# Patient Record
Sex: Female | Born: 1980 | Race: White | Hispanic: No | Marital: Single | Smoking: Current every day smoker
Health system: Southern US, Community
[De-identification: ages and names within clinical notes are randomized; demographics above are authoritative.]

## PROBLEM LIST (undated history)

## (undated) DIAGNOSIS — F32A Depression, unspecified: Secondary | ICD-10-CM

## (undated) DIAGNOSIS — G40909 Epilepsy, unspecified, not intractable, without status epilepticus: Secondary | ICD-10-CM

## (undated) DIAGNOSIS — F419 Anxiety disorder, unspecified: Secondary | ICD-10-CM

## (undated) DIAGNOSIS — F1011 Alcohol abuse, in remission: Secondary | ICD-10-CM

## (undated) DIAGNOSIS — F1910 Other psychoactive substance abuse, uncomplicated: Secondary | ICD-10-CM

## (undated) DIAGNOSIS — F329 Major depressive disorder, single episode, unspecified: Secondary | ICD-10-CM

## (undated) DIAGNOSIS — D649 Anemia, unspecified: Secondary | ICD-10-CM

## (undated) DIAGNOSIS — Z87442 Personal history of urinary calculi: Secondary | ICD-10-CM

## (undated) HISTORY — DX: Major depressive disorder, single episode, unspecified: F32.9

## (undated) HISTORY — PX: COLPOSCOPY: SHX161

## (undated) HISTORY — DX: Personal history of urinary calculi: Z87.442

## (undated) HISTORY — DX: Other psychoactive substance abuse, uncomplicated: F19.10

## (undated) HISTORY — PX: URETERAL STENT PLACEMENT: SHX822

## (undated) HISTORY — DX: Alcohol abuse, in remission: F10.11

## (undated) HISTORY — DX: Anxiety disorder, unspecified: F41.9

## (undated) HISTORY — DX: Depression, unspecified: F32.A

## (undated) HISTORY — PX: URETEROLITHOTOMY: SHX71

## (undated) HISTORY — PX: APPENDECTOMY: SHX54

## (undated) HISTORY — DX: Anemia, unspecified: D64.9

## (undated) HISTORY — DX: Epilepsy, unspecified, not intractable, without status epilepticus: G40.909

---

## 2006-01-20 HISTORY — PX: BREAST BIOPSY: SHX20

## 2009-10-18 DIAGNOSIS — R000 Tachycardia, unspecified: Secondary | ICD-10-CM | POA: Insufficient documentation

## 2010-01-09 DIAGNOSIS — G479 Sleep disorder, unspecified: Secondary | ICD-10-CM | POA: Insufficient documentation

## 2010-08-01 DIAGNOSIS — G40209 Localization-related (focal) (partial) symptomatic epilepsy and epileptic syndromes with complex partial seizures, not intractable, without status epilepticus: Secondary | ICD-10-CM | POA: Insufficient documentation

## 2012-06-30 DIAGNOSIS — G40909 Epilepsy, unspecified, not intractable, without status epilepticus: Secondary | ICD-10-CM | POA: Insufficient documentation

## 2012-07-14 DIAGNOSIS — N200 Calculus of kidney: Secondary | ICD-10-CM | POA: Insufficient documentation

## 2013-07-20 HISTORY — PX: LEEP: SHX91

## 2014-01-20 HISTORY — PX: OVARIAN CYST REMOVAL: SHX89

## 2016-03-31 ENCOUNTER — Ambulatory Visit (INDEPENDENT_AMBULATORY_CARE_PROVIDER_SITE_OTHER): Payer: Managed Care, Other (non HMO) | Admitting: Obstetrics and Gynecology

## 2016-03-31 ENCOUNTER — Encounter: Payer: Self-pay | Admitting: Obstetrics and Gynecology

## 2016-03-31 VITALS — BP 98/64 | HR 72 | Resp 14 | Ht 63.5 in | Wt 113.0 lb

## 2016-03-31 DIAGNOSIS — N6320 Unspecified lump in the left breast, unspecified quadrant: Secondary | ICD-10-CM

## 2016-03-31 DIAGNOSIS — Z23 Encounter for immunization: Secondary | ICD-10-CM | POA: Diagnosis not present

## 2016-03-31 DIAGNOSIS — Z01411 Encounter for gynecological examination (general) (routine) with abnormal findings: Secondary | ICD-10-CM | POA: Diagnosis not present

## 2016-03-31 DIAGNOSIS — Z9189 Other specified personal risk factors, not elsewhere classified: Secondary | ICD-10-CM

## 2016-03-31 DIAGNOSIS — Z113 Encounter for screening for infections with a predominantly sexual mode of transmission: Secondary | ICD-10-CM

## 2016-03-31 DIAGNOSIS — Z124 Encounter for screening for malignant neoplasm of cervix: Secondary | ICD-10-CM

## 2016-03-31 DIAGNOSIS — N6325 Unspecified lump in the left breast, overlapping quadrants: Secondary | ICD-10-CM

## 2016-03-31 DIAGNOSIS — Z0000 Encounter for general adult medical examination without abnormal findings: Secondary | ICD-10-CM | POA: Diagnosis not present

## 2016-03-31 DIAGNOSIS — Z2839 Other underimmunization status: Secondary | ICD-10-CM

## 2016-03-31 LAB — CBC
HCT: 38.8 % (ref 35.0–45.0)
Hemoglobin: 12.7 g/dL (ref 11.7–15.5)
MCH: 30.5 pg (ref 27.0–33.0)
MCHC: 32.7 g/dL (ref 32.0–36.0)
MCV: 93 fL (ref 80.0–100.0)
MPV: 9.9 fL (ref 7.5–12.5)
Platelets: 344 10*3/uL (ref 140–400)
RBC: 4.17 MIL/uL (ref 3.80–5.10)
RDW: 13.1 % (ref 11.0–15.0)
WBC: 4.7 10*3/uL (ref 3.8–10.8)

## 2016-03-31 LAB — COMPREHENSIVE METABOLIC PANEL
ALT: 11 U/L (ref 6–29)
AST: 11 U/L (ref 10–30)
Albumin: 4.5 g/dL (ref 3.6–5.1)
Alkaline Phosphatase: 44 U/L (ref 33–115)
BUN: 9 mg/dL (ref 7–25)
CO2: 27 mmol/L (ref 20–31)
Calcium: 9.6 mg/dL (ref 8.6–10.2)
Chloride: 103 mmol/L (ref 98–110)
Creat: 0.73 mg/dL (ref 0.50–1.10)
Glucose, Bld: 82 mg/dL (ref 65–99)
Potassium: 4.6 mmol/L (ref 3.5–5.3)
Sodium: 138 mmol/L (ref 135–146)
Total Bilirubin: 0.5 mg/dL (ref 0.2–1.2)
Total Protein: 7 g/dL (ref 6.1–8.1)

## 2016-03-31 LAB — LIPID PANEL
Cholesterol: 235 mg/dL — ABNORMAL HIGH (ref <200)
HDL: 69 mg/dL (ref >50)
LDL Cholesterol: 158 mg/dL — ABNORMAL HIGH (ref <100)
Total CHOL/HDL Ratio: 3.4 Ratio (ref <5.0)
Triglycerides: 41 mg/dL (ref <150)
VLDL: 8 mg/dL (ref <30)

## 2016-03-31 LAB — HEPATITIS C ANTIBODY: HCV Ab: NEGATIVE

## 2016-03-31 NOTE — Progress Notes (Signed)
36 y.o. G0P0000 SingleCaucasianF here for annual exam.  The patient has a mirena IUD, placed in 9/15. No menses, no pain. Not sexually active.   She would like STD testing, ex-boyfriend cheated.     No LMP recorded. Patient is not currently having periods (Reason: IUD).          Sexually active: No.  The current method of family planning is IUD.   Mirena placed 09/30/13.  Exercising: No.  The patient does not participate in regular exercise at present. Smoker:  Yes- e-cigs (former smoker of cigarettes)   Health Maintenance: Pap: 02/13/14 negative with negative HPV History of abnormal Pap:  Yes- + HR HPV. H/O HSIL, s/p leep in 7/15 with +margins.  MMG:  2008 -BX of breast- cyst Colonoscopy:  2013 WNL per patient  BMD:   Never TDaP:  unsure Gardasil: no    reports that she has been smoking E-cigarettes.  She has never used smokeless tobacco. She reports that she does not drink alcohol or use drugs. Works in Engineering ge[employer]. Plans to quit e cigarettes.   Past Medical History:  Diagnosis Date  . Anemia   . Anxiety   . Depression   . History of alcohol abuse   . History of kidney stones   . Seizure disorder (HCC)   . Substance abuse   Sober x 3.5 years  Past Surgical History:  Procedure Laterality Date  . APPENDECTOMY    . BREAST BIOPSY    . COLPOSCOPY    . LEEP  07/2013   HSIL  . OVARIAN CYST REMOVAL  2016  . URETERAL STENT PLACEMENT    . URETEROLITHOTOMY      Current Outpatient Prescriptions  Medication Sig Dispense Refill  . Cyanocobalamin (VITAMIN B 12 PO) Take 3,000 mcg by mouth.    . escitalopram (LEXAPRO) 10 MG tablet Take 10 mg by mouth.    . lamoTRIgine (LAMICTAL) 100 MG tablet take 2 tablets by mouth twice a day    . levonorgestrel (MIRENA) 20 MCG/24HR IUD 1 each by Intrauterine route once.    . potassium citrate (UROCIT-K) 10 MEQ (1080 MG) SR tablet Take by mouth.    . Prenatal Vit-Fe Fumarate-FA (PRENATAL MULTIVITAMIN) TABS tablet Take 1 tablet by mouth daily at 12  noon.     No current facility-administered medications for this visit.     Family History  Problem Relation Age of Onset  . Heart disease Father   . Diabetes Maternal Grandmother     Review of Systems  Constitutional: Negative.   HENT: Negative.   Eyes: Negative.   Respiratory: Negative.   Cardiovascular: Negative.   Gastrointestinal: Negative.   Endocrine: Negative.   Genitourinary: Negative.   Musculoskeletal: Negative.   Skin: Negative.   Allergic/Immunologic: Negative.   Neurological: Negative.   Psychiatric/Behavioral: Negative.     Exam:   BP 98/64 (BP Location: Right Arm, Patient Position: Sitting, Cuff Size: Normal)   Pulse 72   Resp 14   Wt 113 lb (51.3 kg)   Weight change: @WEIGHTCHANGE @ Height:      Ht Readings from Last 3 Encounters:  No data found for Ht    General appearance: alert, cooperative and appears stated age Head: Normocephalic, without obvious abnormality, atraumatic Neck: no adenopathy, supple, symmetrical, trachea midline and thyroid normal to inspection and palpation Lungs: clear to auscultation bilaterally Cardiovascular: regular rate and rhythm Breasts: In the left breast a few cm from the areolar region at 12-1 o'clock is a 1.5 cm  smooth, mobile, lima bean shaped, non tender lump Heart: regular rate and rhythm Abdomen: soft, non-tender; bowel sounds normal; no masses,  no organomegaly Extremities: extremities normal, atraumatic, no cyanosis or edema Skin: Skin color, texture, turgor normal. No rashes or lesions Lymph nodes: Cervical, supraclavicular, and axillary nodes normal. No abnormal inguinal nodes palpated Neurologic: Grossly normal   Pelvic: External genitalia:  no lesions              Urethra:  normal appearing urethra with no masses, tenderness or lesions              Bartholins and Skenes: normal                 Vagina: normal appearing vagina with normal color and discharge, no lesions              Cervix: no lesions and  IUD string 1 cm               Bimanual Exam:  Uterus:  normal size, contour, position, consistency, mobility, non-tender and anteverted              Adnexa: no mass, fullness, tenderness               Rectovaginal: Confirms               Anus:  normal sphincter tone, no lesions  Chaperone was present for exam.  A:  Well Woman exam  Breast lump on left  H/O HSIL, S/P LEEP in 2015 with + margins, negative pap and negative hpv in 1/16  P:   Pap with hpv  TDAP today  Screening labs  STD testing  Discussed breast self exam  Discussed calcium and vit D intake  Diagnostic breast imaging, attention left breast at 12 o'clock

## 2016-03-31 NOTE — Patient Instructions (Addendum)
EXERCISE AND DIET:  We recommended that you start or continue a regular exercise program for good health. Regular exercise means any activity that makes your heart beat faster and makes you sweat.  We recommend exercising at least 30 minutes per day at least 3 days a week, preferably 4 or 5.  We also recommend a diet low in fat and sugar.  Inactivity, poor dietary choices and obesity can cause diabetes, heart attack, stroke, and kidney damage, among others.    ALCOHOL AND SMOKING:  Women should limit their alcohol intake to no more than 7 drinks/beers/glasses of wine (combined, not each!) per week. Moderation of alcohol intake to this level decreases your risk of breast cancer and liver damage. And of course, no recreational drugs are part of a healthy lifestyle.  And absolutely no smoking or even second hand smoke. Most people know smoking can cause heart and lung diseases, but did you know it also contributes to weakening of your bones? Aging of your skin?  Yellowing of your teeth and nails?  CALCIUM AND VITAMIN D:  Adequate intake of calcium and Vitamin D are recommended.  The recommendations for exact amounts of these supplements seem to change often, but generally speaking 600 mg of calcium (either carbonate or citrate) and 800 units of Vitamin D per day seems prudent. Certain women may benefit from higher intake of Vitamin D.  If you are among these women, your doctor will have told you during your visit.    PAP SMEARS:  Pap smears, to check for cervical cancer or precancers,  have traditionally been done yearly, although recent scientific advances have shown that most women can have pap smears less often.  However, every woman still should have a physical exam from her gynecologist every year. It will include a breast check, inspection of the vulva and vagina to check for abnormal growths or skin changes, a visual exam of the cervix, and then an exam to evaluate the size and shape of the uterus and  ovaries.  And after 35 years of age, a rectal exam is indicated to check for rectal cancers. We will also provide age appropriate advice regarding health maintenance, like when you should have certain vaccines, screening for sexually transmitted diseases, bone density testing, colonoscopy, mammograms, etc.   MAMMOGRAMS:  All women over 40 years old should have a yearly mammogram. Many facilities now offer a "3D" mammogram, which may cost around $50 extra out of pocket. If possible,  we recommend you accept the option to have the 3D mammogram performed.  It both reduces the number of women who will be called back for extra views which then turn out to be normal, and it is better than the routine mammogram at detecting truly abnormal areas.    COLONOSCOPY:  Colonoscopy to screen for colon cancer is recommended for all women at age 50.  We know, you hate the idea of the prep.  We agree, BUT, having colon cancer and not knowing it is worse!!  Colon cancer so often starts as a polyp that can be seen and removed at colonscopy, which can quite literally save your life!  And if your first colonoscopy is normal and you have no family history of colon cancer, most women don't have to have it again for 10 years.  Once every ten years, you can do something that may end up saving your life, right?  We will be happy to help you get it scheduled when you are ready.    Be sure to check your insurance coverage so you understand how much it will cost.  It may be covered as a preventative service at no cost, but you should check your particular policy.      Breast Self-Awareness Breast self-awareness means being familiar with how your breasts look and feel. It involves checking your breasts regularly and reporting any changes to your health care provider. Practicing breast self-awareness is important. A change in your breasts can be a sign of a serious medical problem. Being familiar with how your breasts look and feel allows  you to find any problems early, when treatment is more likely to be successful. All women should practice breast self-awareness, including women who have had breast implants. How to do a breast self-exam One way to learn what is normal for your breasts and whether your breasts are changing is to do a breast self-exam. To do a breast self-exam: Look for Changes   1. Remove all the clothing above your waist. 2. Stand in front of a mirror in a room with good lighting. 3. Put your hands on your hips. 4. Push your hands firmly downward. 5. Compare your breasts in the mirror. Look for differences between them (asymmetry), such as:  Differences in shape.  Differences in size.  Puckers, dips, and bumps in one breast and not the other. 6. Look at each breast for changes in your skin, such as:  Redness.  Scaly areas. 7. Look for changes in your nipples, such as:  Discharge.  Bleeding.  Dimpling.  Redness.  A change in position. Feel for Changes   Carefully feel your breasts for lumps and changes. It is best to do this while lying on your back on the floor and again while sitting or standing in the shower or tub with soapy water on your skin. Feel each breast in the following way:  Place the arm on the side of the breast you are examining above your head.  Feel your breast with the other hand.  Start in the nipple area and make  inch (2 cm) overlapping circles to feel your breast. Use the pads of your three middle fingers to do this. Apply light pressure, then medium pressure, then firm pressure. The light pressure will allow you to feel the tissue closest to the skin. The medium pressure will allow you to feel the tissue that is a little deeper. The firm pressure will allow you to feel the tissue close to the ribs.  Continue the overlapping circles, moving downward over the breast until you feel your ribs below your breast.  Move one finger-width toward the center of the body.  Continue to use the  inch (2 cm) overlapping circles to feel your breast as you move slowly up toward your collarbone.  Continue the up and down exam using all three pressures until you reach your armpit. Write Down What You Find   Write down what is normal for each breast and any changes that you find. Keep a written record with breast changes or normal findings for each breast. By writing this information down, you do not need to depend only on memory for size, tenderness, or location. Write down where you are in your menstrual cycle, if you are still menstruating. If you are having trouble noticing differences in your breasts, do not get discouraged. With time you will become more familiar with the variations in your breasts and more comfortable with the exam. How often should I examine my   breasts? Examine your breasts every month. If you are breastfeeding, the best time to examine your breasts is after a feeding or after using a breast pump. If you menstruate, the best time to examine your breasts is 5-7 days after your period is over. During your period, your breasts are lumpier, and it may be more difficult to notice changes. When should I see my health care provider? See your health care provider if you notice:  A change in shape or size of your breasts or nipples.  A change in the skin of your breast or nipples, such as a reddened or scaly area.  Unusual discharge from your nipples.  A lump or thick area that was not there before.  Pain in your breasts.  Anything that concerns you. This information is not intended to replace advice given to you by your health care provider. Make sure you discuss any questions you have with your health care provider. Document Released: 01/06/2005 Document Revised: 06/14/2015 Document Reviewed: 11/26/2014 Elsevier Interactive Patient Education  2017 [employer]  

## 2016-04-01 ENCOUNTER — Telehealth: Payer: Self-pay

## 2016-04-01 DIAGNOSIS — N6320 Unspecified lump in the left breast, unspecified quadrant: Secondary | ICD-10-CM

## 2016-04-01 LAB — STD PANEL
HIV 1&2 Ab, 4th Generation: NONREACTIVE
Hepatitis B Surface Ag: NEGATIVE

## 2016-04-01 LAB — VITAMIN D 25 HYDROXY (VIT D DEFICIENCY, FRACTURES): Vit D, 25-Hydroxy: 30 ng/mL (ref 30–100)

## 2016-04-01 NOTE — Telephone Encounter (Signed)
Call to the [facility] appointment for bilateral diagnostic imaging with left breast ultrasound scheduled for 04/07/2016 at 9 am with 8:45 am arrival.  Left message for patient to call Kaitlyn at [phone].

## 2016-04-01 NOTE — Telephone Encounter (Signed)
-----   Message from Romualdo BolkJill Evelyn Jertson, MD sent at 03/31/2016 11:12 AM EDT ----- Please set this patient up for diagnostic breast imaging. She has a lump in the left breast at 12-1 o'clock.  Thank you!!! Noreene LarssonJill

## 2016-04-01 NOTE — Telephone Encounter (Signed)
Spoke with patient. Advised of appointment date and time with the [facility] as seen below. Patient is agreeable to date and time. Placed in mammogram hold.  Routing to provider for final review. Patient agreeable to disposition. Will close encounter.

## 2016-04-02 LAB — IPS N GONORRHOEA AND CHLAMYDIA BY PCR

## 2016-04-03 LAB — IPS PAP TEST WITH HPV

## 2016-04-07 ENCOUNTER — Ambulatory Visit
Admission: RE | Admit: 2016-04-07 | Discharge: 2016-04-07 | Disposition: A | Discharge status: Home / Self Care | Payer: Managed Care, Other (non HMO) | Source: Ambulatory Visit | Attending: Obstetrics and Gynecology | Admitting: Obstetrics and Gynecology

## 2016-04-07 DIAGNOSIS — N6320 Unspecified lump in the left breast, unspecified quadrant: Secondary | ICD-10-CM

## 2016-04-08 ENCOUNTER — Telehealth: Payer: Self-pay

## 2016-04-08 NOTE — Telephone Encounter (Signed)
-----   Message from Romualdo BolkJill Evelyn Jertson, MD sent at 04/08/2016 10:45 AM EDT ----- Benign ultrasound findings. Please have the patient continue monthly breast self exams and f/u for a breast check in 6 weeks (from her last visit)

## 2016-04-08 NOTE — Telephone Encounter (Signed)
Left message to call Kaitlyn at [phone]. 

## 2016-04-14 NOTE — Telephone Encounter (Signed)
Spoke with patient. Advised of message as seen below from Dr.Jertson. Patient is agreeable. 6 week recheck scheduled for 05/14/2016 at 8:30 am with Dr.Jertson. Patient is agreeable to date and time.  Routing to provider for final review. Patient agreeable to disposition. Will close encounter.

## 2016-05-09 ENCOUNTER — Telehealth: Payer: Self-pay | Admitting: Obstetrics and Gynecology

## 2016-05-09 NOTE — Telephone Encounter (Signed)
Patient called to cancel her breast recheck for 05/13/16. She will call back to reschedule when she has her work schedule available.  Routing to provider for review only. Will keep in my in-basket to monitor patient rescheding.

## 2016-05-14 ENCOUNTER — Ambulatory Visit: Payer: Managed Care, Other (non HMO) | Admitting: Obstetrics and Gynecology

## 2017-04-02 ENCOUNTER — Ambulatory Visit (INDEPENDENT_AMBULATORY_CARE_PROVIDER_SITE_OTHER): Payer: Managed Care, Other (non HMO) | Admitting: Obstetrics and Gynecology

## 2017-04-02 ENCOUNTER — Encounter: Payer: Self-pay | Admitting: Obstetrics and Gynecology

## 2017-04-02 ENCOUNTER — Other Ambulatory Visit: Payer: Self-pay

## 2017-04-02 ENCOUNTER — Telehealth: Payer: Self-pay | Admitting: Obstetrics and Gynecology

## 2017-04-02 VITALS — BP 100/58 | HR 80 | Resp 12 | Ht 64.0 in | Wt 120.0 lb

## 2017-04-02 DIAGNOSIS — Z01419 Encounter for gynecological examination (general) (routine) without abnormal findings: Secondary | ICD-10-CM | POA: Diagnosis not present

## 2017-04-02 DIAGNOSIS — Z0000 Encounter for general adult medical examination without abnormal findings: Secondary | ICD-10-CM

## 2017-04-02 DIAGNOSIS — N6310 Unspecified lump in the right breast, unspecified quadrant: Secondary | ICD-10-CM

## 2017-04-02 DIAGNOSIS — T8332XA Displacement of intrauterine contraceptive device, initial encounter: Secondary | ICD-10-CM

## 2017-04-02 DIAGNOSIS — E559 Vitamin D deficiency, unspecified: Secondary | ICD-10-CM | POA: Diagnosis not present

## 2017-04-02 DIAGNOSIS — Z30431 Encounter for routine checking of intrauterine contraceptive device: Secondary | ICD-10-CM | POA: Diagnosis not present

## 2017-04-02 NOTE — Patient Instructions (Signed)
EXERCISE AND DIET:  We recommended that you start or continue a regular exercise program for good health. Regular exercise means any activity that makes your heart beat faster and makes you sweat.  We recommend exercising at least 30 minutes per day at least 3 days a week, preferably 4 or 5.  We also recommend a diet low in fat and sugar.  Inactivity, poor dietary choices and obesity can cause diabetes, heart attack, stroke, and kidney damage, among others.    ALCOHOL AND SMOKING:  Women should limit their alcohol intake to no more than 7 drinks/beers/glasses of wine (combined, not each!) per week. Moderation of alcohol intake to this level decreases your risk of breast cancer and liver damage. And of course, no recreational drugs are part of a healthy lifestyle.  And absolutely no smoking or even second hand smoke. Most people know smoking can cause heart and lung diseases, but did you know it also contributes to weakening of your bones? Aging of your skin?  Yellowing of your teeth and nails?  CALCIUM AND VITAMIN D:  Adequate intake of calcium and Vitamin D are recommended.  The recommendations for exact amounts of these supplements seem to change often, but generally speaking 600 mg of calcium (either carbonate or citrate) and 800 units of Vitamin D per day seems prudent. Certain women may benefit from higher intake of Vitamin D.  If you are among these women, your doctor will have told you during your visit.    PAP SMEARS:  Pap smears, to check for cervical cancer or precancers,  have traditionally been done yearly, although recent scientific advances have shown that most women can have pap smears less often.  However, every woman still should have a physical exam from her gynecologist every year. It will include a breast check, inspection of the vulva and vagina to check for abnormal growths or skin changes, a visual exam of the cervix, and then an exam to evaluate the size and shape of the uterus and  ovaries.  And after 36 years of age, a rectal exam is indicated to check for rectal cancers. We will also provide age appropriate advice regarding health maintenance, like when you should have certain vaccines, screening for sexually transmitted diseases, bone density testing, colonoscopy, mammograms, etc.   MAMMOGRAMS:  All women over 40 years old should have a yearly mammogram. Many facilities now offer a "3D" mammogram, which may cost around $50 extra out of pocket. If possible,  we recommend you accept the option to have the 3D mammogram performed.  It both reduces the number of women who will be called back for extra views which then turn out to be normal, and it is better than the routine mammogram at detecting truly abnormal areas.    COLONOSCOPY:  Colonoscopy to screen for colon cancer is recommended for all women at age 50.  We know, you hate the idea of the prep.  We agree, BUT, having colon cancer and not knowing it is worse!!  Colon cancer so often starts as a polyp that can be seen and removed at colonscopy, which can quite literally save your life!  And if your first colonoscopy is normal and you have no family history of colon cancer, most women don't have to have it again for 10 years.  Once every ten years, you can do something that may end up saving your life, right?  We will be happy to help you get it scheduled when you are ready.    Be sure to check your insurance coverage so you understand how much it will cost.  It may be covered as a preventative service at no cost, but you should check your particular policy.      Breast Self-Awareness Breast self-awareness means being familiar with how your breasts look and feel. It involves checking your breasts regularly and reporting any changes to your health care provider. Practicing breast self-awareness is important. A change in your breasts can be a sign of a serious medical problem. Being familiar with how your breasts look and feel allows  you to find any problems early, when treatment is more likely to be successful. All women should practice breast self-awareness, including women who have had breast implants. How to do a breast self-exam One way to learn what is normal for your breasts and whether your breasts are changing is to do a breast self-exam. To do a breast self-exam: Look for Changes  1. Remove all the clothing above your waist. 2. Stand in front of a mirror in a room with good lighting. 3. Put your hands on your hips. 4. Push your hands firmly downward. 5. Compare your breasts in the mirror. Look for differences between them (asymmetry), such as: ? Differences in shape. ? Differences in size. ? Puckers, dips, and bumps in one breast and not the other. 6. Look at each breast for changes in your skin, such as: ? Redness. ? Scaly areas. 7. Look for changes in your nipples, such as: ? Discharge. ? Bleeding. ? Dimpling. ? Redness. ? A change in position. Feel for Changes  Carefully feel your breasts for lumps and changes. It is best to do this while lying on your back on the floor and again while sitting or standing in the shower or tub with soapy water on your skin. Feel each breast in the following way:  Place the arm on the side of the breast you are examining above your head.  Feel your breast with the other hand.  Start in the nipple area and make  inch (2 cm) overlapping circles to feel your breast. Use the pads of your three middle fingers to do this. Apply light pressure, then medium pressure, then firm pressure. The light pressure will allow you to feel the tissue closest to the skin. The medium pressure will allow you to feel the tissue that is a little deeper. The firm pressure will allow you to feel the tissue close to the ribs.  Continue the overlapping circles, moving downward over the breast until you feel your ribs below your breast.  Move one finger-width toward the center of the body.  Continue to use the  inch (2 cm) overlapping circles to feel your breast as you move slowly up toward your collarbone.  Continue the up and down exam using all three pressures until you reach your armpit.  Write Down What You Find  Write down what is normal for each breast and any changes that you find. Keep a written record with breast changes or normal findings for each breast. By writing this information down, you do not need to depend only on memory for size, tenderness, or location. Write down where you are in your menstrual cycle, if you are still menstruating. If you are having trouble noticing differences in your breasts, do not get discouraged. With time you will become more familiar with the variations in your breasts and more comfortable with the exam. How often should I examine my breasts? Examine   your breasts every month. If you are breastfeeding, the best time to examine your breasts is after a feeding or after using a breast pump. If you menstruate, the best time to examine your breasts is 5-7 days after your period is over. During your period, your breasts are lumpier, and it may be more difficult to notice changes. When should I see my health care provider? See your health care provider if you notice:  A change in shape or size of your breasts or nipples.  A change in the skin of your breast or nipples, such as a reddened or scaly area.  Unusual discharge from your nipples.  A lump or thick area that was not there before.  Pain in your breasts.  Anything that concerns you.  This information is not intended to replace advice given to you by your health care provider. Make sure you discuss any questions you have with your health care provider. Document Released: 01/06/2005 Document Revised: 06/14/2015 Document Reviewed: 11/26/2014 Elsevier Interactive Patient Education  2018 [employer].  

## 2017-04-02 NOTE — Progress Notes (Signed)
Patient scheduled while in office for bilateral dx MMG and bilateral US, if needed. [facility] scheduled for [facility]17 arriving at 8:20am for 8:40am appt. Patient declined earlier appt offered. Patient verbalizes understanding and is agreeable.

## 2017-04-02 NOTE — Telephone Encounter (Signed)
Patient calling to schedule recommended ultrasound procedure. Patient aware of insurance benefits and agreeable to proceed with scheduling.   Cc: Kelly DingwallSuzy Dixon

## 2017-04-02 NOTE — Telephone Encounter (Signed)
Call patient to review benefits for a recommended ultrasound. Left voicemail message requesting a return call. °

## 2017-04-02 NOTE — Progress Notes (Signed)
37 y.o. G0P0000 SingleCaucasianF here for annual exam.  She has a mirena IUD, placed in 9/15. No currently sexually active.  H/O normal cycles.  The patient has known breast fibroadenomas.     No LMP recorded. Patient is not currently having periods (Reason: IUD).          Sexually active: No.  The current method of family planning is IUD.    Exercising: No.  The patient does not participate in regular exercise at present. Smoker:  Yes E-Cig  Health Maintenance: Pap:  03-31-16 WNL NEG HR HPV , 02-13-14 negative with negative HPV History of abnormal Pap:  Yes- 2014+HPV  MMG:  04-07-16 breast U/S WNL Colonoscopy:  2011  BMD:   Never TDaP:  03-31-16 Gardasil: no, discussed, information given.    reports that she has been smoking e-cigarettes.  she has never used smokeless tobacco. She reports that she does not drink alcohol or use drugs. Works in Engineering geologist.   Past Medical History:  Diagnosis Date  . Anemia   . Anxiety   . Depression   . History of alcohol abuse   . History of kidney stones   . Seizure disorder (HCC)   . Substance abuse [facility])     Past Surgical History:  Procedure Laterality Date  . APPENDECTOMY    . BREAST BIOPSY Left 2008   Benign  . BREAST BIOPSY Right 2008   Benign  . COLPOSCOPY    . LEEP  07/2013   HSIL  . OVARIAN CYST REMOVAL  2016  . URETERAL STENT PLACEMENT    . URETEROLITHOTOMY      Current Outpatient Medications  Medication Sig Dispense Refill  . Cyanocobalamin (VITAMIN B 12 PO) Take 3,000 mcg by mouth.    . escitalopram (LEXAPRO) 10 MG tablet Take 10 mg by mouth.    . lamoTRIgine (LAMICTAL) 100 MG tablet take 2 tablets by mouth twice a day    . levonorgestrel (MIRENA) 20 MCG/24HR IUD 1 each by Intrauterine route once.    . potassium citrate (UROCIT-K) 10 MEQ (1080 MG) SR tablet Take by mouth.    . Prenatal Vit-Fe Fumarate-FA (PRENATAL MULTIVITAMIN) TABS tablet Take 1 tablet by mouth daily at 12 noon.     No current facility-administered  medications for this visit.     Family History  Problem Relation Age of Onset  . Heart disease Father   . Diabetes Maternal Grandmother     Review of Systems  Constitutional: Negative.   HENT: Negative.   Eyes: Negative.   Respiratory: Negative.   Cardiovascular: Negative.   Gastrointestinal: Negative.   Endocrine: Negative.   Genitourinary: Negative.   Musculoskeletal: Negative.   Skin: Negative.   Allergic/Immunologic: Negative.   Neurological: Negative.   Psychiatric/Behavioral: Negative.     Exam:   BP (!) 100/58 (BP Location: Right Arm, Patient Position: Sitting, Cuff Size: Normal)   Pulse 80   Resp 12   Ht 5\' 4"  (1.626 m)   Wt 120 lb (54.4 kg)   BMI 20.60 kg/m   Weight change: @WEIGHTCHANGE @ Height:   Height: 5\' 4"  (162.6 cm)  Ht Readings from Last 3 Encounters:  04/02/17 5\' 4"  (1.626 m)  03/31/16 5' 3.5" (1.613 m)    General appearance: alert, cooperative and appears stated age Head: Normocephalic, without obvious abnormality, atraumatic Neck: no adenopathy, supple, symmetrical, trachea midline and thyroid normal to inspection and palpation Lungs: clear to auscultation bilaterally Cardiovascular: regular rate and rhythm Breasts: bilateral fibrocystic changes, most prominent  on the inner mid aspect of the right breast, some on the left. In the right breast at 2 o'clock, just outside the areolar region is an oval shaped, 1 cm not tender lump. There is also increased nodulartity in the right breast around 4 o'clock.  Abdomen: soft, non-tender; non distended,  no masses,  no organomegaly Extremities: extremities normal, atraumatic, no cyanosis or edema Skin: Skin color, texture, turgor normal. No rashes or lesions Lymph nodes: Cervical, supraclavicular, and axillary nodes normal. No abnormal inguinal nodes palpated Neurologic: Grossly normal   Pelvic: External genitalia:  no lesions              Urethra:  normal appearing urethra with no masses, tenderness or  lesions              Bartholins and Skenes: normal                 Vagina: normal appearing vagina with normal color and discharge, no lesions              Cervix: no lesions and IUD string not seen               Bimanual Exam:  Uterus:  normal size, contour, position, consistency, mobility, non-tender              Adnexa: no mass, fullness, tenderness               Rectovaginal: Confirms               Anus:  normal sphincter tone, no lesions  Chaperone was present for exam.  A:  Well Woman exam  Fibrocystic breasts  Right breast lump at 2 o'clock, increased nodularity at 4 o'clock also increased nodularity of the left inner mid breast  IUD string missing  H/O vit d def  P:   No pap this year  Diagnostic bilateral breast imaging  Return for U/S for IUD location  Screening labs, vit D  Due for IUD removal in 9/20  Gardasil information given

## 2017-04-03 LAB — CBC
Hematocrit: 40.2 % (ref 34.0–46.6)
Hemoglobin: 13.3 g/dL (ref 11.1–15.9)
MCH: 31.2 pg (ref 26.6–33.0)
MCHC: 33.1 g/dL (ref 31.5–35.7)
MCV: 94 fL (ref 79–97)
Platelets: 337 10*3/uL (ref 150–379)
RBC: 4.26 x10E6/uL (ref 3.77–5.28)
RDW: 13.5 % (ref 12.3–15.4)
WBC: 6.1 10*3/uL (ref 3.4–10.8)

## 2017-04-03 LAB — VITAMIN D 25 HYDROXY (VIT D DEFICIENCY, FRACTURES): Vit D, 25-Hydroxy: 18.8 ng/mL — ABNORMAL LOW (ref 30.0–100.0)

## 2017-04-03 LAB — COMPREHENSIVE METABOLIC PANEL
ALT: 28 IU/L (ref 0–32)
AST: 16 IU/L (ref 0–40)
Albumin/Globulin Ratio: 1.8 (ref 1.2–2.2)
Albumin: 4.6 g/dL (ref 3.5–5.5)
Alkaline Phosphatase: 49 IU/L (ref 39–117)
BUN/Creatinine Ratio: 11 (ref 9–23)
BUN: 7 mg/dL (ref 6–20)
Bilirubin Total: 0.2 mg/dL (ref 0.0–1.2)
CO2: 24 mmol/L (ref 20–29)
Calcium: 9.7 mg/dL (ref 8.7–10.2)
Chloride: 102 mmol/L (ref 96–106)
Creatinine, Ser: 0.64 mg/dL (ref 0.57–1.00)
GFR calc Af Amer: 134 mL/min/{1.73_m2} (ref >59)
GFR calc non Af Amer: 116 mL/min/{1.73_m2} (ref >59)
Globulin, Total: 2.6 g/dL (ref 1.5–4.5)
Glucose: 82 mg/dL (ref 65–99)
Potassium: 4.3 mmol/L (ref 3.5–5.2)
Sodium: 141 mmol/L (ref 134–144)
Total Protein: 7.2 g/dL (ref 6.0–8.5)

## 2017-04-03 LAB — LIPID PANEL
Chol/HDL Ratio: 3.5 ratio (ref 0.0–4.4)
Cholesterol, Total: 251 mg/dL — ABNORMAL HIGH (ref 100–199)
HDL: 72 mg/dL (ref >39)
LDL Calculated: 165 mg/dL — ABNORMAL HIGH (ref 0–99)
Triglycerides: 68 mg/dL (ref 0–149)
VLDL Cholesterol Cal: 14 mg/dL (ref 5–40)

## 2017-04-03 NOTE — Telephone Encounter (Signed)
Spoke with patient. PUS scheduled for 05/12/17 at 12:30pm, consult to follow at 1pm with Dr. Oscar LaJertson. Patient declined earlier appointments offered d/t work schedule.   Routing to provider for final review. Patient is agreeable to disposition. Will close encounter.  Cc: Harland DingwallSuzy Dixon

## 2017-04-09 ENCOUNTER — Ambulatory Visit
Admission: RE | Admit: 2017-04-09 | Discharge: 2017-04-09 | Disposition: A | Discharge status: Home / Self Care | Payer: Managed Care, Other (non HMO) | Source: Ambulatory Visit | Attending: Obstetrics and Gynecology | Admitting: Obstetrics and Gynecology

## 2017-04-09 ENCOUNTER — Telehealth: Payer: Self-pay | Admitting: *Deleted

## 2017-04-09 DIAGNOSIS — N6310 Unspecified lump in the right breast, unspecified quadrant: Secondary | ICD-10-CM

## 2017-04-09 NOTE — Telephone Encounter (Signed)
-----   Message from Romualdo BolkJill Evelyn Jertson, MD sent at 04/09/2017 12:09 PM EDT ----- Normal breast imaging. Please set the patient up for a f/u breast check in 3 months and have her do monthly breast self exams. You can take her out of mammogram hold.

## 2017-04-09 NOTE — Telephone Encounter (Signed)
Notes recorded by Leda MinHamm, Lambert Jeanty N, RN on 04/09/2017 at 1:34 PM EDT Left message to call Noreene LarssonJill at [phone].

## 2017-04-09 NOTE — Telephone Encounter (Signed)
Spoke with patient, advised as seen below per Dr. Oscar LaJertson. OV scheduled for 06/25/17 at 8:15am with Dr. Oscar LaJertson.   Patient is agreeable to disposition. Will close encounter.

## 2017-04-09 NOTE — Telephone Encounter (Signed)
Patient called to return call from Jill. °

## 2017-05-12 ENCOUNTER — Telehealth: Payer: Self-pay | Admitting: Obstetrics and Gynecology

## 2017-05-12 ENCOUNTER — Ambulatory Visit (INDEPENDENT_AMBULATORY_CARE_PROVIDER_SITE_OTHER): Payer: Managed Care, Other (non HMO) | Admitting: Obstetrics and Gynecology

## 2017-05-12 ENCOUNTER — Ambulatory Visit (INDEPENDENT_AMBULATORY_CARE_PROVIDER_SITE_OTHER): Payer: Managed Care, Other (non HMO)

## 2017-05-12 ENCOUNTER — Other Ambulatory Visit: Payer: Self-pay

## 2017-05-12 ENCOUNTER — Encounter: Payer: Self-pay | Admitting: Obstetrics and Gynecology

## 2017-05-12 VITALS — BP 98/56 | HR 72 | Resp 12 | Ht 64.0 in | Wt 120.0 lb

## 2017-05-12 DIAGNOSIS — T8332XD Displacement of intrauterine contraceptive device, subsequent encounter: Secondary | ICD-10-CM

## 2017-05-12 DIAGNOSIS — Z30431 Encounter for routine checking of intrauterine contraceptive device: Secondary | ICD-10-CM | POA: Diagnosis not present

## 2017-05-12 NOTE — Telephone Encounter (Signed)
Spoke with patient. Confirmed address on file, updated demographics. Will place copy of 04/02/17 labs in mail.   Routing to provider for final review. Patient is agreeable to disposition. Will close encounter.

## 2017-05-12 NOTE — Telephone Encounter (Signed)
Return call to Jill. °

## 2017-05-12 NOTE — Telephone Encounter (Signed)
Patient would like blood test results from DOS 04/02/17 mailed to her.

## 2017-05-12 NOTE — Progress Notes (Signed)
GYNECOLOGY  VISIT   HPI: 37 y.o.   Single  Caucasian  female   G0P0000 with No LMP recorded. (Menstrual status: IUD).   here for   Pelvic ultrasound for IUD location. On recent exam IUD strings were missing.   GYNECOLOGIC HISTORY: No LMP recorded. (Menstrual status: IUD). Contraception: IUD  Menopausal hormone therapy: none        OB History    Gravida  0   Para  0   Term  0   Preterm  0   AB  0   Living  0     SAB  0   TAB  0   Ectopic  0   Multiple  0   Live Births  0              There are no active problems to display for this patient.   Past Medical History:  Diagnosis Date  . Anemia   . Anxiety   . Depression   . History of alcohol abuse   . History of kidney stones   . Seizure disorder (HCC)   . Substance abuse [facility](HCC)     Past Surgical History:  Procedure Laterality Date  . APPENDECTOMY    . BREAST BIOPSY Left 2008   Benign  . BREAST BIOPSY Right 2008   Benign  . COLPOSCOPY    . LEEP  07/2013   HSIL  . OVARIAN CYST REMOVAL  2016  . URETERAL STENT PLACEMENT    . URETEROLITHOTOMY      Current Outpatient Medications  Medication Sig Dispense Refill  . Cyanocobalamin (VITAMIN B 12 PO) Take 3,000 mcg by mouth.    . escitalopram (LEXAPRO) 10 MG tablet Take 10 mg by mouth.    . lamoTRIgine (LAMICTAL) 100 MG tablet take 2 tablets by mouth twice a day    . levonorgestrel (MIRENA) 20 MCG/24HR IUD 1 each by Intrauterine route once.    . potassium citrate (UROCIT-K) 10 MEQ (1080 MG) SR tablet Take by mouth.    . Prenatal Vit-Fe Fumarate-FA (PRENATAL MULTIVITAMIN) TABS tablet Take 1 tablet by mouth daily at 12 noon.     No current facility-administered medications for this visit.      ALLERGIES: Iodinated diagnostic agents; Ciprofloxacin; Oxybutynin; Promethazine; and Oxycodone  Family History  Problem Relation Age of Onset  . Heart disease Father   . Diabetes Maternal Grandmother     Social History   Socioeconomic History  .  Marital status: Single    Spouse name: Not on file  . Number of children: Not on file  . Years of education: Not on file  . Highest education level: Not on file  Occupational History  . Not on file  Social Needs  . Financial resource strain: Not on file  . Food insecurity:    Worry: Not on file    Inability: Not on file  . Transportation needs:    Medical: Not on file    Non-medical: Not on file  Tobacco Use  . Smoking status: Current Every Day Smoker    Types: E-cigarettes  . Smokeless tobacco: Never Used  Substance and Sexual Activity  . Alcohol use: No  . Drug use: No  . Sexual activity: Never    Birth control/protection: IUD  Lifestyle  . Physical activity:    Days per week: Not on file    Minutes per session: Not on file  . Stress: Not on file  Relationships  . Social connections:  Talks on phone: Not on file    Gets together: Not on file    Attends religious service: Not on file    Active member of club or organization: Not on file    Attends meetings of clubs or organizations: Not on file    Relationship status: Not on file  . Intimate partner violence:    Fear of current or ex partner: Not on file    Emotionally abused: Not on file    Physically abused: Not on file    Forced sexual activity: Not on file  Other Topics Concern  . Not on file  Social History Narrative  . Not on file    Review of Systems  Constitutional: Negative.   HENT: Negative.   Eyes: Negative.   Respiratory: Negative.   Cardiovascular: Negative.   Gastrointestinal: Negative.   Genitourinary: Negative.   Musculoskeletal: Negative.   Skin: Negative.   Neurological: Negative.   Endo/Heme/Allergies: Negative.   Psychiatric/Behavioral: Negative.     PHYSICAL EXAMINATION:    BP (!) 98/56 (BP Location: Right Arm, Patient Position: Sitting, Cuff Size: Normal)   Pulse 72   Resp 12   Ht 5\' 4"  (1.626 m)   Wt 120 lb (54.4 kg)   BMI 20.60 kg/m     General appearance: alert,  cooperative and appears stated age  Ultrasound images were reviewed with the patient  ASSESSMENT IUD strings missing    PLAN IUD in place on u/s   An After Visit Summary was printed and given to the patient.

## 2017-05-12 NOTE — Telephone Encounter (Signed)
Left message to call Morgyn Marut at [phone].  

## 2017-06-22 NOTE — Progress Notes (Signed)
GYNECOLOGY  VISIT   HPI: 37 y.o.   Single  Caucasian  female   G0P0000 with No LMP recorded. (Menstrual status: IUD).   here for 3 month breast check. She was noted to have increased nodularity in her right breast and bilateral fibrocystic changes. She does not routinely do breast checks on herself  GYNECOLOGIC HISTORY: No LMP recorded. (Menstrual status: IUD). Contraception: Mirena IUD Menopausal hormone therapy: none        OB History    Gravida  0   Para  0   Term  0   Preterm  0   AB  0   Living  0     SAB  0   TAB  0   Ectopic  0   Multiple  0   Live Births  0              There are no active problems to display for this patient.   Past Medical History:  Diagnosis Date  . Anemia   . Anxiety   . Depression   . History of alcohol abuse   . History of kidney stones   . Seizure disorder (HCC)   . Substance abuse [facility])     Past Surgical History:  Procedure Laterality Date  . APPENDECTOMY    . BREAST BIOPSY Left 2008   Benign  . BREAST BIOPSY Right 2008   Benign  . COLPOSCOPY    . LEEP  07/2013   HSIL  . OVARIAN CYST REMOVAL  2016  . URETERAL STENT PLACEMENT    . URETEROLITHOTOMY      Current Outpatient Medications  Medication Sig Dispense Refill  . escitalopram (LEXAPRO) 10 MG tablet Take 10 mg by mouth.    . lamoTRIgine (LAMICTAL) 100 MG tablet take 2 tablets by mouth twice a day    . levonorgestrel (MIRENA) 20 MCG/24HR IUD 1 each by Intrauterine route once.    . Prenatal Vit-Fe Fumarate-FA (PRENATAL MULTIVITAMIN) TABS tablet Take 1 tablet by mouth daily at 12 noon.     No current facility-administered medications for this visit.      ALLERGIES: Iodinated diagnostic agents; Ciprofloxacin; Oxybutynin; Promethazine; and Oxycodone  Family History  Problem Relation Age of Onset  . Heart disease Father   . Diabetes Maternal Grandmother     Social History   Socioeconomic History  . Marital status: Single    Spouse name: Not on  file  . Number of children: Not on file  . Years of education: Not on file  . Highest education level: Not on file  Occupational History  . Not on file  Social Needs  . Financial resource strain: Not on file  . Food insecurity:    Worry: Not on file    Inability: Not on file  . Transportation needs:    Medical: Not on file    Non-medical: Not on file  Tobacco Use  . Smoking status: Current Every Day Smoker    Types: E-cigarettes  . Smokeless tobacco: Never Used  Substance and Sexual Activity  . Alcohol use: No  . Drug use: No  . Sexual activity: Never    Birth control/protection: IUD  Lifestyle  . Physical activity:    Days per week: Not on file    Minutes per session: Not on file  . Stress: Not on file  Relationships  . Social connections:    Talks on phone: Not on file    Gets together: Not on file  Attends religious service: Not on file    Active member of club or organization: Not on file    Attends meetings of clubs or organizations: Not on file    Relationship status: Not on file  . Intimate partner violence:    Fear of current or ex partner: Not on file    Emotionally abused: Not on file    Physically abused: Not on file    Forced sexual activity: Not on file  Other Topics Concern  . Not on file  Social History Narrative  . Not on file    Review of Systems  Constitutional: Negative.   HENT: Negative.   Eyes: Negative.   Respiratory: Negative.   Cardiovascular: Negative.   Gastrointestinal: Negative.   Genitourinary: Negative.   Musculoskeletal: Negative.   Skin: Negative.   Neurological: Negative.   Endo/Heme/Allergies: Negative.   Psychiatric/Behavioral: Negative.     PHYSICAL EXAMINATION:    BP 100/60 (BP Location: Right Arm, Patient Position: Sitting, Cuff Size: Normal)   Pulse 64   Wt 120 lb 12.8 oz (54.8 kg)   BMI 20.74 kg/m     General appearance: alert, cooperative and appears stated age Breasts: bilateral fibrocystic changes,  slightly increased nodularity in the right breast at 2 and 4 o'clock. Stable No supraclavicular or axillary adenopathy.  ASSESSMENT Fibrocystic breast changes, stable, negative imaging    PLAN Discussed breast self exam F/U in 3/20 for an annual exam, sooner with concerns   An After Visit Summary was printed and given to the patient.

## 2017-06-24 ENCOUNTER — Ambulatory Visit (INDEPENDENT_AMBULATORY_CARE_PROVIDER_SITE_OTHER): Payer: Managed Care, Other (non HMO) | Admitting: Obstetrics and Gynecology

## 2017-06-24 ENCOUNTER — Encounter: Payer: Self-pay | Admitting: Obstetrics and Gynecology

## 2017-06-24 VITALS — BP 100/60 | HR 64 | Wt 120.8 lb

## 2017-06-24 DIAGNOSIS — N6012 Diffuse cystic mastopathy of left breast: Secondary | ICD-10-CM

## 2017-06-24 DIAGNOSIS — N6011 Diffuse cystic mastopathy of right breast: Secondary | ICD-10-CM | POA: Diagnosis not present

## 2018-04-22 ENCOUNTER — Ambulatory Visit: Payer: Managed Care, Other (non HMO) | Admitting: Obstetrics and Gynecology

## 2018-06-18 ENCOUNTER — Encounter: Payer: Self-pay | Admitting: Obstetrics and Gynecology

## 2018-06-18 ENCOUNTER — Other Ambulatory Visit: Payer: Self-pay

## 2018-06-18 ENCOUNTER — Ambulatory Visit (INDEPENDENT_AMBULATORY_CARE_PROVIDER_SITE_OTHER): Payer: Managed Care, Other (non HMO) | Admitting: Obstetrics and Gynecology

## 2018-06-18 VITALS — BP 112/68 | HR 80 | Temp 98.2°F | Ht 64.0 in | Wt 129.6 lb

## 2018-06-18 DIAGNOSIS — Z0000 Encounter for general adult medical examination without abnormal findings: Secondary | ICD-10-CM | POA: Diagnosis not present

## 2018-06-18 DIAGNOSIS — Z3009 Encounter for other general counseling and advice on contraception: Secondary | ICD-10-CM

## 2018-06-18 DIAGNOSIS — Z01419 Encounter for gynecological examination (general) (routine) without abnormal findings: Secondary | ICD-10-CM | POA: Diagnosis not present

## 2018-06-18 DIAGNOSIS — E559 Vitamin D deficiency, unspecified: Secondary | ICD-10-CM

## 2018-06-18 NOTE — Patient Instructions (Signed)
EXERCISE AND DIET:  We recommended that you start or continue a regular exercise program for good health. Regular exercise means any activity that makes your heart beat faster and makes you sweat.  We recommend exercising at least 30 minutes per day at least 3 days a week, preferably 4 or 5.  We also recommend a diet low in fat and sugar.  Inactivity, poor dietary choices and obesity can cause diabetes, heart attack, stroke, and kidney damage, among others.    ALCOHOL AND SMOKING:  Women should limit their alcohol intake to no more than 7 drinks/beers/glasses of wine (combined, not each!) per week. Moderation of alcohol intake to this level decreases your risk of breast cancer and liver damage. And of course, no recreational drugs are part of a healthy lifestyle.  And absolutely no smoking or even second hand smoke. Most people know smoking can cause heart and lung diseases, but did you know it also contributes to weakening of your bones? Aging of your skin?  Yellowing of your teeth and nails?  CALCIUM AND VITAMIN D:  Adequate intake of calcium and Vitamin D are recommended.  The recommendations for exact amounts of these supplements seem to change often, but generally speaking 1,000 mg of calcium (between diet and supplement) and 800 units of Vitamin D per day seems prudent. Certain women may benefit from higher intake of Vitamin D.  If you are among these women, your doctor will have told you during your visit.    PAP SMEARS:  Pap smears, to check for cervical cancer or precancers,  have traditionally been done yearly, although recent scientific advances have shown that most women can have pap smears less often.  However, every woman still should have a physical exam from her gynecologist every year. It will include a breast check, inspection of the vulva and vagina to check for abnormal growths or skin changes, a visual exam of the cervix, and then an exam to evaluate the size and shape of the uterus and  ovaries.  And after 37 years of age, a rectal exam is indicated to check for rectal cancers. We will also provide age appropriate advice regarding health maintenance, like when you should have certain vaccines, screening for sexually transmitted diseases, bone density testing, colonoscopy, mammograms, etc.   MAMMOGRAMS:  All women over 40 years old should have a yearly mammogram. Many facilities now offer a "3D" mammogram, which may cost around $50 extra out of pocket. If possible,  we recommend you accept the option to have the 3D mammogram performed.  It both reduces the number of women who will be called back for extra views which then turn out to be normal, and it is better than the routine mammogram at detecting truly abnormal areas.    COLON CANCER SCREENING: Now recommend starting at age 45. At this time colonoscopy is not covered for routine screening until 50. There are take home tests that can be done between 45-49.   COLONOSCOPY:  Colonoscopy to screen for colon cancer is recommended for all women at age 50.  We know, you hate the idea of the prep.  We agree, BUT, having colon cancer and not knowing it is worse!!  Colon cancer so often starts as a polyp that can be seen and removed at colonscopy, which can quite literally save your life!  And if your first colonoscopy is normal and you have no family history of colon cancer, most women don't have to have it again for   10 years.  Once every ten years, you can do something that may end up saving your life, right?  We will be happy to help you get it scheduled when you are ready.  Be sure to check your insurance coverage so you understand how much it will cost.  It may be covered as a preventative service at no cost, but you should check your particular policy.      Breast Self-Awareness Breast self-awareness means being familiar with how your breasts look and feel. It involves checking your breasts regularly and reporting any changes to your  health care provider. Practicing breast self-awareness is important. A change in your breasts can be a sign of a serious medical problem. Being familiar with how your breasts look and feel allows you to find any problems early, when treatment is more likely to be successful. All women should practice breast self-awareness, including women who have had breast implants. How to do a breast self-exam One way to learn what is normal for your breasts and whether your breasts are changing is to do a breast self-exam. To do a breast self-exam: Look for Changes  1. Remove all the clothing above your waist. 2. Stand in front of a mirror in a room with good lighting. 3. Put your hands on your hips. 4. Push your hands firmly downward. 5. Compare your breasts in the mirror. Look for differences between them (asymmetry), such as: ? Differences in shape. ? Differences in size. ? Puckers, dips, and bumps in one breast and not the other. 6. Look at each breast for changes in your skin, such as: ? Redness. ? Scaly areas. 7. Look for changes in your nipples, such as: ? Discharge. ? Bleeding. ? Dimpling. ? Redness. ? A change in position. Feel for Changes Carefully feel your breasts for lumps and changes. It is best to do this while lying on your back on the floor and again while sitting or standing in the shower or tub with soapy water on your skin. Feel each breast in the following way:  Place the arm on the side of the breast you are examining above your head.  Feel your breast with the other hand.  Start in the nipple area and make  inch (2 cm) overlapping circles to feel your breast. Use the pads of your three middle fingers to do this. Apply light pressure, then medium pressure, then firm pressure. The light pressure will allow you to feel the tissue closest to the skin. The medium pressure will allow you to feel the tissue that is a little deeper. The firm pressure will allow you to feel the tissue  close to the ribs.  Continue the overlapping circles, moving downward over the breast until you feel your ribs below your breast.  Move one finger-width toward the center of the body. Continue to use the  inch (2 cm) overlapping circles to feel your breast as you move slowly up toward your collarbone.  Continue the up and down exam using all three pressures until you reach your armpit.  Write Down What You Find  Write down what is normal for each breast and any changes that you find. Keep a written record with breast changes or normal findings for each breast. By writing this information down, you do not need to depend only on memory for size, tenderness, or location. Write down where you are in your menstrual cycle, if you are still menstruating. If you are having trouble noticing differences   in your breasts, do not get discouraged. With time you will become more familiar with the variations in your breasts and more comfortable with the exam. How often should I examine my breasts? Examine your breasts every month. If you are breastfeeding, the best time to examine your breasts is after a feeding or after using a breast pump. If you menstruate, the best time to examine your breasts is 5-7 days after your period is over. During your period, your breasts are lumpier, and it may be more difficult to notice changes. When should I see my health care provider? See your health care provider if you notice:  A change in shape or size of your breasts or nipples.  A change in the skin of your breast or nipples, such as a reddened or scaly area.  Unusual discharge from your nipples.  A lump or thick area that was not there before.  Pain in your breasts.  Anything that concerns you.  

## 2018-06-18 NOTE — Progress Notes (Signed)
38 y.o. G0P0000 Single White or Caucasian Not Hispanic or Latino female here for annual exam.  She has a mirena IUD, placed in 9/15. Last year the strings were missing, IUD in place on ultrasound.  Prior to the IUD her cycles were normal.  Not sexually active since her last exam.     H/O breast fibroadenomas and breast biopsies.   No LMP recorded. (Menstrual status: IUD).          Sexually active: No.  The current method of family planning is none.    Exercising: No.  The patient does not participate in regular exercise at present. Smoker:  Yes, e-cigarettes (not ready to quit).  Health Maintenance: Pap:  03-31-16 WNL NEG HR HPV , 02-13-14 negative with negative HPV History of abnormal Pap:  Yes- 2014+HPV  MMG:  04/09/2017 Birads 2 benign, screening at 41 Colonoscopy:  2011  BMD:   Never TDaP:  03-31-16 Gardasil: No   reports that she has been smoking e-cigarettes. She has never used smokeless tobacco. She reports that she does not drink alcohol or use drugs. 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 Anemia   . Anxiety   . Depression   . History of alcohol abuse   . History of kidney stones   . Seizure disorder (HCC)   . Substance abuse [facility])     Past Surgical History:  Procedure Laterality Date  . APPENDECTOMY    . BREAST BIOPSY Left 2008   Benign  . BREAST BIOPSY Right 2008   Benign  . COLPOSCOPY    . LEEP  07/2013   HSIL  . OVARIAN CYST REMOVAL  2016  . URETERAL STENT PLACEMENT    . URETEROLITHOTOMY      Current Outpatient Medications  Medication Sig Dispense Refill  . escitalopram (LEXAPRO) 10 MG tablet Take 10 mg by mouth.    . lamoTRIgine (LAMICTAL) 100 MG tablet take 2 tablets by mouth twice a day    . levonorgestrel (MIRENA) 20 MCG/24HR IUD 1 each by Intrauterine route once.    . Prenatal Vit-Fe Fumarate-FA (PRENATAL MULTIVITAMIN) TABS tablet Take 1 tablet by mouth daily at 12 noon.     No current facility-administered medications  for this visit.     Family History  Problem Relation Age of Onset  . Heart disease Father   . Diabetes Maternal Grandmother     Review of Systems  Constitutional: Negative.   HENT: Negative.   Eyes: Negative.   Respiratory: Negative.   Cardiovascular: Negative.   Gastrointestinal: Negative.   Endocrine: Negative.   Genitourinary: Negative.   Musculoskeletal: Negative.   Skin: Negative.   Allergic/Immunologic: Negative.   Neurological: Negative.   Hematological: Negative.   Psychiatric/Behavioral: Negative.     Exam:   BP 112/68 (BP Location: Right Arm, Patient Position: Sitting, Cuff Size: Normal)   Pulse 80   Temp 98.2 F (36.8 C) (Skin)   Ht 5\' 4"  (1.626 m)   Wt 129 lb 9.6 oz (58.8 kg)   BMI 22.25 kg/m   Weight change: @WEIGHTCHANGE @ Height:   Height: 5\' 4"  (162.6 cm)  Ht Readings from Last 3 Encounters:  06/18/18 5\' 4"  (1.626 m)  05/12/17 5\' 4"  (1.626 m)  04/02/17 5\' 4"  (1.626 m)    General appearance: alert, cooperative and appears stated age Head: Normocephalic, without obvious abnormality, atraumatic Neck: no adenopathy, supple, symmetrical, trachea midline and thyroid normal to inspection and palpation Lungs: clear to auscultation bilaterally  Cardiovascular: regular rate and rhythm Breasts: normal appearance, no masses or tenderness Abdomen: soft, non-tender; non distended,  no masses,  no organomegaly Extremities: extremities normal, atraumatic, no cyanosis or edema Skin: Skin color, texture, turgor normal. No rashes or lesions Lymph nodes: Cervical, supraclavicular, and axillary nodes normal. No abnormal inguinal nodes palpated Neurologic: Grossly normal   Pelvic: External genitalia:  no lesions              Urethra:  normal appearing urethra with no masses, tenderness or lesions              Bartholins and Skenes: normal                 Vagina: normal appearing vagina with normal color and discharge, no lesions              Cervix: no lesions and  IUD strings 1 cm (previously not seen)               Bimanual Exam:  Uterus:  normal size, contour, position, consistency, mobility, non-tender and anteverted              Adnexa: no mass, fullness, tenderness               Rectovaginal: Confirms               Anus:  normal sphincter tone, no lesions  Chaperone was present for exam.  A:  Well Woman with normal exam  IUD check, previously missing IUD strings are seen again    P:   Return for IUD removal and reinsertion in 9/20  Discussed breast self exam  Discussed calcium and vit D intake  No pap needed  Information on gardasil given  Condoms if sexually active  Return for fasting labs

## 2018-06-28 ENCOUNTER — Other Ambulatory Visit: Payer: Self-pay

## 2018-06-28 ENCOUNTER — Other Ambulatory Visit (INDEPENDENT_AMBULATORY_CARE_PROVIDER_SITE_OTHER): Payer: Managed Care, Other (non HMO)

## 2018-06-28 DIAGNOSIS — E559 Vitamin D deficiency, unspecified: Secondary | ICD-10-CM

## 2018-06-28 DIAGNOSIS — Z0000 Encounter for general adult medical examination without abnormal findings: Secondary | ICD-10-CM

## 2018-06-29 LAB — LIPID PANEL
Chol/HDL Ratio: 3.9 ratio (ref 0.0–4.4)
Cholesterol, Total: 250 mg/dL — ABNORMAL HIGH (ref 100–199)
HDL: 64 mg/dL (ref >39)
LDL Calculated: 174 mg/dL — ABNORMAL HIGH (ref 0–99)
Triglycerides: 62 mg/dL (ref 0–149)
VLDL Cholesterol Cal: 12 mg/dL (ref 5–40)

## 2018-06-29 LAB — COMPREHENSIVE METABOLIC PANEL
ALT: 18 IU/L (ref 0–32)
AST: 15 IU/L (ref 0–40)
Albumin/Globulin Ratio: 2 (ref 1.2–2.2)
Albumin: 4.6 g/dL (ref 3.8–4.8)
Alkaline Phosphatase: 57 IU/L (ref 39–117)
BUN/Creatinine Ratio: 16 (ref 9–23)
BUN: 10 mg/dL (ref 6–20)
Bilirubin Total: 0.4 mg/dL (ref 0.0–1.2)
CO2: 23 mmol/L (ref 20–29)
Calcium: 9.4 mg/dL (ref 8.7–10.2)
Chloride: 100 mmol/L (ref 96–106)
Creatinine, Ser: 0.63 mg/dL (ref 0.57–1.00)
GFR calc Af Amer: 133 mL/min/{1.73_m2} (ref >59)
GFR calc non Af Amer: 115 mL/min/{1.73_m2} (ref >59)
Globulin, Total: 2.3 g/dL (ref 1.5–4.5)
Glucose: 93 mg/dL (ref 65–99)
Potassium: 4.4 mmol/L (ref 3.5–5.2)
Sodium: 137 mmol/L (ref 134–144)
Total Protein: 6.9 g/dL (ref 6.0–8.5)

## 2018-06-29 LAB — VITAMIN D 25 HYDROXY (VIT D DEFICIENCY, FRACTURES): Vit D, 25-Hydroxy: 30.9 ng/mL (ref 30.0–100.0)

## 2018-06-29 LAB — CBC
Hematocrit: 38.8 % (ref 34.0–46.6)
Hemoglobin: 13.3 g/dL (ref 11.1–15.9)
MCH: 32.4 pg (ref 26.6–33.0)
MCHC: 34.3 g/dL (ref 31.5–35.7)
MCV: 94 fL (ref 79–97)
Platelets: 352 10*3/uL (ref 150–450)
RBC: 4.11 x10E6/uL (ref 3.77–5.28)
RDW: 12.2 % (ref 11.7–15.4)
WBC: 4.7 10*3/uL (ref 3.4–10.8)

## 2018-09-10 ENCOUNTER — Telehealth: Payer: Self-pay | Admitting: Obstetrics and Gynecology

## 2018-09-10 NOTE — Telephone Encounter (Signed)
Call placed to convey benefits for Mirena exchange. 

## 2018-09-13 NOTE — Telephone Encounter (Signed)
Patient returning call.

## 2018-09-14 NOTE — Telephone Encounter (Signed)
Spoke with patient she understands/agreeable with the benefits. Patient aware of the cancellation policy. Appointment scheduled 09/23/18.

## 2018-09-23 ENCOUNTER — Ambulatory Visit: Payer: Managed Care, Other (non HMO) | Admitting: Obstetrics and Gynecology

## 2018-09-30 ENCOUNTER — Other Ambulatory Visit: Payer: Self-pay

## 2018-09-30 NOTE — Progress Notes (Signed)
GYNECOLOGY  VISIT   HPI: 38 y.o.   Single White or Caucasian Not Hispanic or Latino  female   G0P0000 with No LMP recorded (lmp unknown). (Menstrual status: IUD).   here for IUD exchange. She has a mirena IUD, placed in 9/15. Patient states that she has taken 400mg  of Iburprofen around 8:00am   UPT: Negative GYNECOLOGIC HISTORY: No LMP recorded (lmp unknown). (Menstrual status: IUD). Contraception:Mirena IUD Menopausal hormone therapy: n/a        OB History    Gravida  0   Para  0   Term  0   Preterm  0   AB  0   Living  0     SAB  0   TAB  0   Ectopic  0   Multiple  0   Live Births  0              There are no active problems to display for this patient.   Past Medical History:  Diagnosis Date  . Anemia   . Anxiety   . Depression   . History of alcohol abuse   . History of kidney stones   . Seizure disorder ([place])   . Substance abuse [facility])     Past Surgical History:  Procedure Laterality Date  . APPENDECTOMY    . BREAST BIOPSY Left 2008   Benign  . BREAST BIOPSY Right 2008   Benign  . COLPOSCOPY    . LEEP  07/2013   HSIL  . OVARIAN CYST REMOVAL  2016  . URETERAL STENT PLACEMENT    . URETEROLITHOTOMY      Current Outpatient Medications  Medication Sig Dispense Refill  . escitalopram (LEXAPRO) 10 MG tablet Take 10 mg by mouth.    . lamoTRIgine (LAMICTAL) 100 MG tablet take 2 tablets by mouth twice a day    . levonorgestrel (MIRENA) 20 MCG/24HR IUD 1 each by Intrauterine route once.     No current facility-administered medications for this visit.      ALLERGIES: Iodinated diagnostic agents, Ciprofloxacin, Oxybutynin, Promethazine, and Oxycodone  Family History  Problem Relation Age of Onset  . Heart disease Father   . Diabetes Maternal Grandmother     Social History   Socioeconomic History  . Marital status: Single    Spouse name: Not on file  . Number of children: Not on file  . Years of education: Not on file  . Highest  education level: Not on file  Occupational History  . Not on file  Social Needs  . Financial resource strain: Not on file  . Food insecurity    Worry: Not on file    Inability: Not on file  . Transportation needs    Medical: Not on file    Non-medical: Not on file  Tobacco Use  . Smoking status: Current Every Day Smoker    Types: E-cigarettes  . Smokeless tobacco: Never Used  Substance and Sexual Activity  . Alcohol use: No  . Drug use: No  . Sexual activity: Not Currently    Birth control/protection: I.U.D.  Lifestyle  . Physical activity    Days per week: Not on file    Minutes per session: Not on file  . Stress: Not on file  Relationships  . Social Herbalist on phone: Not on file    Gets together: Not on file    Attends religious service: Not on file    Active member of club  or organization: Not on file    Attends meetings of clubs or organizations: Not on file    Relationship status: Not on file  . Intimate partner violence    Fear of current or ex partner: Not on file    Emotionally abused: Not on file    Physically abused: Not on file    Forced sexual activity: Not on file  Other Topics Concern  . Not on file  Social History Narrative  . Not on file    Review of Systems  Constitutional: Negative.   HENT: Negative.   Eyes: Negative.   Respiratory: Negative.   Cardiovascular: Negative.   Gastrointestinal: Negative.   Genitourinary: Negative.   Musculoskeletal: Negative.   Skin: Negative.   Neurological: Negative.   Endo/Heme/Allergies: Negative.   Psychiatric/Behavioral: Negative.     PHYSICAL EXAMINATION:    BP 102/64 (BP Location: Right Arm, Patient Position: Sitting, Cuff Size: Normal)   Pulse 84   Temp (!) 97.2 F (36.2 C) (Temporal)   Resp 12   Wt 127 lb 9.6 oz (57.9 kg)   LMP  (LMP Unknown)   BMI 21.90 kg/m     General appearance: alert, cooperative and appears stated age  Pelvic: External genitalia:  no lesions               Urethra:  normal appearing urethra with no masses, tenderness or lesions              Bartholins and Skenes: normal                 Vagina: normal appearing vagina with normal color and discharge, no lesions              Cervix: no lesions  The risks of the mirena IUD were reviewed with the patient, including infection, abnormal bleeding and uterine perfortion. Consent was signed.  A speculum was placed in the vagina, the cervix was cleansed with betadine. The IUD string was right inside the cervix and was removed with packing forceps. A tenaculum was placed on the cervix, the uterus sounded to 7 cm. The cervix was easily dilated to a 5 hagar dilator  The mirnea IUD was inserted without difficulty. The string were cut to 3-4 cm. The tenaculum was removed. The patient felt nausea right after the procedure and the speculum was removed.   The patient had a vagal reaction, nausea, dry heaved. . Pulse 64, BP 90/60 Given Zofran, dizziness and nausea improved over ~20 minutes. Pain improved. Left the office feeling well.       Chaperone was present for exam.  ASSESSMENT IUD removal and reinsertion of new mirena IUD Vagal reaction, recovered with supportive measures.     PLAN F/U in one month   An After Visit Summary was printed and given to the patient.

## 2018-10-01 ENCOUNTER — Encounter: Payer: Self-pay | Admitting: Obstetrics and Gynecology

## 2018-10-01 ENCOUNTER — Ambulatory Visit (INDEPENDENT_AMBULATORY_CARE_PROVIDER_SITE_OTHER): Payer: Managed Care, Other (non HMO) | Admitting: Obstetrics and Gynecology

## 2018-10-01 VITALS — BP 102/64 | HR 84 | Temp 97.2°F | Resp 12 | Wt 127.6 lb

## 2018-10-01 DIAGNOSIS — Z3009 Encounter for other general counseling and advice on contraception: Secondary | ICD-10-CM | POA: Diagnosis not present

## 2018-10-01 DIAGNOSIS — Z01812 Encounter for preprocedural laboratory examination: Secondary | ICD-10-CM

## 2018-10-01 DIAGNOSIS — Z30433 Encounter for removal and reinsertion of intrauterine contraceptive device: Secondary | ICD-10-CM

## 2018-10-01 DIAGNOSIS — R55 Syncope and collapse: Secondary | ICD-10-CM

## 2018-10-01 LAB — POCT URINE PREGNANCY: Preg Test, Ur: NEGATIVE

## 2018-10-01 NOTE — Patient Instructions (Signed)

## 2018-10-27 ENCOUNTER — Other Ambulatory Visit: Payer: Self-pay

## 2018-10-27 NOTE — Progress Notes (Signed)
GYNECOLOGY  VISIT   HPI: 38 y.o.   Single White or Caucasian Not Hispanic or Latino  female   G0P0000 with No LMP recorded (lmp unknown). (Menstrual status: IUD).   here for 4 week IUD check. She had her old mirena removed and a new one placed last month. No problems. No bleeding. Not sexually active since insertion.   GYNECOLOGIC HISTORY: No LMP recorded (lmp unknown). (Menstrual status: IUD). Contraception:Mirena IUD  Menopausal hormone therapy: n/a        OB History    Gravida  0   Para  0   Term  0   Preterm  0   AB  0   Living  0     SAB  0   TAB  0   Ectopic  0   Multiple  0   Live Births  0              There are no active problems to display for this patient.   Past Medical History:  Diagnosis Date  . Anemia   . Anxiety   . Depression   . History of alcohol abuse   . History of kidney stones   . Seizure disorder ([place])   . Substance abuse [facility])     Past Surgical History:  Procedure Laterality Date  . APPENDECTOMY    . BREAST BIOPSY Left 2008   Benign  . BREAST BIOPSY Right 2008   Benign  . COLPOSCOPY    . LEEP  07/2013   HSIL  . OVARIAN CYST REMOVAL  2016  . URETERAL STENT PLACEMENT    . URETEROLITHOTOMY      Current Outpatient Medications  Medication Sig Dispense Refill  . Cholecalciferol (VITAMIN D3 PO) Take 1,000 Units by mouth daily.    Marland Kitchen escitalopram (LEXAPRO) 10 MG tablet Take 10 mg by mouth.    . lamoTRIgine (LAMICTAL) 100 MG tablet take 2 tablets by mouth twice a day    . levonorgestrel (MIRENA) 20 MCG/24HR IUD 1 each by Intrauterine route once.     No current facility-administered medications for this visit.      ALLERGIES: Iodinated diagnostic agents, Ciprofloxacin, Oxybutynin, Promethazine, and Oxycodone  Family History  Problem Relation Age of Onset  . Heart disease Father   . Diabetes Maternal Grandmother     Social History   Socioeconomic History  . Marital status: Single    Spouse name: Not on file  .  Number of children: Not on file  . Years of education: Not on file  . Highest education level: Not on file  Occupational History  . Not on file  Social Needs  . Financial resource strain: Not on file  . Food insecurity    Worry: Not on file    Inability: Not on file  . Transportation needs    Medical: Not on file    Non-medical: Not on file  Tobacco Use  . Smoking status: Current Every Day Smoker    Types: E-cigarettes  . Smokeless tobacco: Never Used  Substance and Sexual Activity  . Alcohol use: No  . Drug use: No  . Sexual activity: Not Currently    Birth control/protection: I.U.D.  Lifestyle  . Physical activity    Days per week: Not on file    Minutes per session: Not on file  . Stress: Not on file  Relationships  . Social Herbalist on phone: Not on file    Gets together: Not  on file    Attends religious service: Not on file    Active member of club or organization: Not on file    Attends meetings of clubs or organizations: Not on file    Relationship status: Not on file  . Intimate partner violence    Fear of current or ex partner: Not on file    Emotionally abused: Not on file    Physically abused: Not on file    Forced sexual activity: Not on file  Other Topics Concern  . Not on file  Social History Narrative  . Not on file    Review of Systems  Constitutional: Negative.   HENT: Negative.   Eyes: Negative.   Respiratory: Negative.   Cardiovascular: Negative.   Gastrointestinal: Negative.   Genitourinary: Negative.   Musculoskeletal: Negative.   Skin: Negative.   Neurological: Negative.   Endo/Heme/Allergies: Negative.   Psychiatric/Behavioral: Negative.     PHYSICAL EXAMINATION:    BP 108/60 (BP Location: Right Arm, Patient Position: Sitting, Cuff Size: Normal)   Pulse 76   Temp 97.8 F (36.6 C) (Temporal)   Resp 12   Wt 129 lb (58.5 kg)   LMP  (LMP Unknown)   BMI 22.14 kg/m     General appearance: alert, cooperative and  appears stated age  Pelvic: External genitalia:  no lesions              Urethra:  normal appearing urethra with no masses, tenderness or lesions              Bartholins and Skenes: normal                 Vagina: normal appearing vagina with normal color and discharge, no lesions              Cervix: no lesions and IUD strings 3-4 cm              Bimanual Exam:  Uterus:  normal size, contour, position, consistency, mobility, non-tender              Adnexa: no mass, fullness, tenderness                Chaperone was present for exam.  ASSESSMENT IUD check, doing well    PLAN F/U for annual exam in 6/21   An After Visit Summary was printed and given to the patient.

## 2018-10-28 ENCOUNTER — Encounter: Payer: Self-pay | Admitting: Obstetrics and Gynecology

## 2018-10-28 ENCOUNTER — Ambulatory Visit (INDEPENDENT_AMBULATORY_CARE_PROVIDER_SITE_OTHER): Payer: Managed Care, Other (non HMO) | Admitting: Obstetrics and Gynecology

## 2018-10-28 VITALS — BP 108/60 | HR 76 | Temp 97.8°F | Resp 12 | Wt 129.0 lb

## 2018-10-28 DIAGNOSIS — Z30431 Encounter for routine checking of intrauterine contraceptive device: Secondary | ICD-10-CM

## 2019-06-29 ENCOUNTER — Ambulatory Visit: Payer: Managed Care, Other (non HMO) | Admitting: Obstetrics and Gynecology

## 2019-11-07 ENCOUNTER — Encounter: Payer: Self-pay | Admitting: Obstetrics and Gynecology

## 2019-11-07 ENCOUNTER — Ambulatory Visit (INDEPENDENT_AMBULATORY_CARE_PROVIDER_SITE_OTHER): Payer: BC Managed Care – PPO | Admitting: Obstetrics and Gynecology

## 2019-11-07 ENCOUNTER — Other Ambulatory Visit: Payer: Self-pay

## 2019-11-07 ENCOUNTER — Other Ambulatory Visit (HOSPITAL_COMMUNITY)
Admission: RE | Admit: 2019-11-07 | Discharge: 2019-11-07 | Disposition: A | Discharge status: Home / Self Care | Payer: BC Managed Care – PPO | Source: Ambulatory Visit | Attending: Obstetrics and Gynecology | Admitting: Obstetrics and Gynecology

## 2019-11-07 VITALS — BP 132/84 | HR 93 | Ht 63.75 in | Wt 153.0 lb

## 2019-11-07 DIAGNOSIS — Z0000 Encounter for general adult medical examination without abnormal findings: Secondary | ICD-10-CM | POA: Diagnosis not present

## 2019-11-07 DIAGNOSIS — Z124 Encounter for screening for malignant neoplasm of cervix: Secondary | ICD-10-CM | POA: Insufficient documentation

## 2019-11-07 DIAGNOSIS — Z30431 Encounter for routine checking of intrauterine contraceptive device: Secondary | ICD-10-CM | POA: Diagnosis not present

## 2019-11-07 DIAGNOSIS — Z23 Encounter for immunization: Secondary | ICD-10-CM

## 2019-11-07 DIAGNOSIS — Z7185 Encounter for immunization safety counseling: Secondary | ICD-10-CM

## 2019-11-07 DIAGNOSIS — Z01419 Encounter for gynecological examination (general) (routine) without abnormal findings: Secondary | ICD-10-CM

## 2019-11-07 NOTE — Progress Notes (Signed)
39 y.o. G0P0000 Single White or Caucasian Not Hispanic or Latino female here for annual exam.  She has a mirena IUD, placed in 9/20. No bleeding. Not sexually active.     H/O breast fibroadenomas and breast biopsies.   No LMP recorded. (Menstrual status: IUD).          Sexually active: No.  The current method of family planning is IUD.    Exercising: No.  The patient does not participate in regular exercise at present. Smoker:  Yes Vapes   Health Maintenance: Pap:  03-31-16 WNL NEG HR HPV, 02-13-14 negative with negative HPV History of abnormal Pap: Yes- H/O leep 7/15 MMG:  04/09/2017 Birads 2 benign, screening at 41 BMD:   None  Colonoscopy: 2011  TDaP:  2018  Gardasil:no, counseled.      reports that she has been smoking e-cigarettes. She has never used smokeless tobacco. She reports that she does not drink alcohol and does not use drugs. Works in the [employer] site.   Past Medical History:  Diagnosis Date  . Anemia   . Anxiety   . Depression   . History of alcohol abuse   . History of kidney stones   . Seizure disorder (HCC)   . Substance abuse [facility])     Past Surgical History:  Procedure Laterality Date  . APPENDECTOMY    . BREAST BIOPSY Left 2008   Benign  . BREAST BIOPSY Right 2008   Benign  . COLPOSCOPY    . LEEP  07/2013   HSIL  . OVARIAN CYST REMOVAL  2016  . URETERAL STENT PLACEMENT    . URETEROLITHOTOMY      Current Outpatient Medications  Medication Sig Dispense Refill  . Cholecalciferol (VITAMIN D3 PO) Take 1,000 Units by mouth daily.    Marland Kitchen escitalopram (LEXAPRO) 10 MG tablet Take 10 mg by mouth.    . lamoTRIgine (LAMICTAL) 100 MG tablet take 2 tablets by mouth twice a day    . levonorgestrel (MIRENA) 20 MCG/24HR IUD 1 each by Intrauterine route once.     No current facility-administered medications for this visit.    Family History  Problem Relation Age of Onset  . Heart disease Father   . Diabetes Maternal Grandmother      Review of Systems  All other systems reviewed and are negative.   Exam:   BP 132/84   Pulse 93   Ht 5' 3.75" (1.619 m)   Wt 153 lb (69.4 kg)   SpO2 98%   BMI 26.47 kg/m   Weight change: @WEIGHTCHANGE @ Height:   Height: 5' 3.75" (161.9 cm)  Ht Readings from Last 3 Encounters:  11/07/19 5' 3.75" (1.619 m)  06/18/18 5\' 4"  (1.626 m)  05/12/17 5\' 4"  (1.626 m)    General appearance: alert, cooperative and appears stated age Head: Normocephalic, without obvious abnormality, atraumatic Neck: no adenopathy, supple, symmetrical, trachea midline and thyroid normal to inspection and palpation Lungs: clear to auscultation bilaterally Cardiovascular: regular rate and rhythm Breasts: normal appearance, no masses or tenderness Abdomen: soft, non-tender; non distended,  no masses,  no organomegaly Extremities: extremities normal, atraumatic, no cyanosis or edema Skin: Skin color, texture, turgor normal. No rashes or lesions Lymph nodes: Cervical, supraclavicular, and axillary nodes normal. No abnormal inguinal nodes palpated Neurologic: Grossly normal   Pelvic: External genitalia:  no lesions              Urethra:  normal appearing urethra with no masses, tenderness or lesions  Bartholins and Skenes: normal                 Vagina: normal appearing vagina with normal color and discharge, no lesions              Cervix: no lesions and IUD string 3 cm               Bimanual Exam:  Uterus:  normal size, contour, position, consistency, mobility, non-tender              Adnexa: no mass, fullness, tenderness               Rectovaginal: Confirms               Anus:  normal sphincter tone, no lesions  Carolynn Serve chaperoned for the exam.  A:  Well Woman with normal exam  IUD check, doing well  H/O leep 2015  P:   Pap with hpv  Start gardasil series today  Screening labs  Discussed breast self exam  Discussed calcium and vit D intake

## 2019-11-07 NOTE — Patient Instructions (Signed)
EXERCISE AND DIET:  We recommended that you start or continue a regular exercise program for good health. Regular exercise means any activity that makes your heart beat faster and makes you sweat.  We recommend exercising at least 30 minutes per day at least 3 days a week, preferably 4 or 5.  We also recommend a diet low in fat and sugar.  Inactivity, poor dietary choices and obesity can cause diabetes, heart attack, stroke, and kidney damage, among others.    ALCOHOL AND SMOKING:  Women should limit their alcohol intake to no more than 7 drinks/beers/glasses of wine (combined, not each!) per week. Moderation of alcohol intake to this level decreases your risk of breast cancer and liver damage. And of course, no recreational drugs are part of a healthy lifestyle.  And absolutely no smoking or even second hand smoke. Most people know smoking can cause heart and lung diseases, but did you know it also contributes to weakening of your bones? Aging of your skin?  Yellowing of your teeth and nails?  CALCIUM AND VITAMIN D:  Adequate intake of calcium and Vitamin D are recommended.  The recommendations for exact amounts of these supplements seem to change often, but generally speaking 1,000 mg of calcium (between diet and supplement) and 800 units of Vitamin D per day seems prudent. Certain women may benefit from higher intake of Vitamin D.  If you are among these women, your doctor will have told you during your visit.    PAP SMEARS:  Pap smears, to check for cervical cancer or precancers,  have traditionally been done yearly, although recent scientific advances have shown that most women can have pap smears less often.  However, every woman still should have a physical exam from her gynecologist every year. It will include a breast check, inspection of the vulva and vagina to check for abnormal growths or skin changes, a visual exam of the cervix, and then an exam to evaluate the size and shape of the uterus and  ovaries.  And after 38 years of age, a rectal exam is indicated to check for rectal cancers. We will also provide age appropriate advice regarding health maintenance, like when you should have certain vaccines, screening for sexually transmitted diseases, bone density testing, colonoscopy, mammograms, etc.   MAMMOGRAMS:  All women over 40 years old should have a yearly mammogram. Many facilities now offer a "3D" mammogram, which may cost around $50 extra out of pocket. If possible,  we recommend you accept the option to have the 3D mammogram performed.  It both reduces the number of women who will be called back for extra views which then turn out to be normal, and it is better than the routine mammogram at detecting truly abnormal areas.    COLON CANCER SCREENING: Now recommend starting at age 45. At this time colonoscopy is not covered for routine screening until 50. There are take home tests that can be done between 45-49.   COLONOSCOPY:  Colonoscopy to screen for colon cancer is recommended for all women at age 50.  We know, you hate the idea of the prep.  We agree, BUT, having colon cancer and not knowing it is worse!!  Colon cancer so often starts as a polyp that can be seen and removed at colonscopy, which can quite literally save your life!  And if your first colonoscopy is normal and you have no family history of colon cancer, most women don't have to have it again for   10 years.  Once every ten years, you can do something that may end up saving your life, right?  We will be happy to help you get it scheduled when you are ready.  Be sure to check your insurance coverage so you understand how much it will cost.  It may be covered as a preventative service at no cost, but you should check your particular policy.      Breast Self-Awareness Breast self-awareness means being familiar with how your breasts look and feel. It involves checking your breasts regularly and reporting any changes to your  health care provider. Practicing breast self-awareness is important. A change in your breasts can be a sign of a serious medical problem. Being familiar with how your breasts look and feel allows you to find any problems early, when treatment is more likely to be successful. All women should practice breast self-awareness, including women who have had breast implants. How to do a breast self-exam One way to learn what is normal for your breasts and whether your breasts are changing is to do a breast self-exam. To do a breast self-exam: Look for Changes  1. Remove all the clothing above your waist. 2. Stand in front of a mirror in a room with good lighting. 3. Put your hands on your hips. 4. Push your hands firmly downward. 5. Compare your breasts in the mirror. Look for differences between them (asymmetry), such as: ? Differences in shape. ? Differences in size. ? Puckers, dips, and bumps in one breast and not the other. 6. Look at each breast for changes in your skin, such as: ? Redness. ? Scaly areas. 7. Look for changes in your nipples, such as: ? Discharge. ? Bleeding. ? Dimpling. ? Redness. ? A change in position. Feel for Changes Carefully feel your breasts for lumps and changes. It is best to do this while lying on your back on the floor and again while sitting or standing in the shower or tub with soapy water on your skin. Feel each breast in the following way:  Place the arm on the side of the breast you are examining above your head.  Feel your breast with the other hand.  Start in the nipple area and make  inch (2 cm) overlapping circles to feel your breast. Use the pads of your three middle fingers to do this. Apply light pressure, then medium pressure, then firm pressure. The light pressure will allow you to feel the tissue closest to the skin. The medium pressure will allow you to feel the tissue that is a little deeper. The firm pressure will allow you to feel the tissue  close to the ribs.  Continue the overlapping circles, moving downward over the breast until you feel your ribs below your breast.  Move one finger-width toward the center of the body. Continue to use the  inch (2 cm) overlapping circles to feel your breast as you move slowly up toward your collarbone.  Continue the up and down exam using all three pressures until you reach your armpit.  Write Down What You Find  Write down what is normal for each breast and any changes that you find. Keep a written record with breast changes or normal findings for each breast. By writing this information down, you do not need to depend only on memory for size, tenderness, or location. Write down where you are in your menstrual cycle, if you are still menstruating. If you are having trouble noticing differences   in your breasts, do not get discouraged. With time you will become more familiar with the variations in your breasts and more comfortable with the exam. How often should I examine my breasts? Examine your breasts every month. If you are breastfeeding, the best time to examine your breasts is after a feeding or after using a breast pump. If you menstruate, the best time to examine your breasts is 5-7 days after your period is over. During your period, your breasts are lumpier, and it may be more difficult to notice changes. When should I see my health care provider? See your health care provider if you notice:  A change in shape or size of your breasts or nipples.  A change in the skin of your breast or nipples, such as a reddened or scaly area.  Unusual discharge from your nipples.  A lump or thick area that was not there before.  Pain in your breasts.  Anything that concerns you.  

## 2019-11-08 LAB — CBC
Hematocrit: 40 % (ref 34.0–46.6)
Hemoglobin: 14 g/dL (ref 11.1–15.9)
MCH: 33.6 pg — ABNORMAL HIGH (ref 26.6–33.0)
MCHC: 35 g/dL (ref 31.5–35.7)
MCV: 96 fL (ref 79–97)
Platelets: 294 10*3/uL (ref 150–450)
RBC: 4.17 x10E6/uL (ref 3.77–5.28)
RDW: 12.6 % (ref 11.7–15.4)
WBC: 8 10*3/uL (ref 3.4–10.8)

## 2019-11-08 LAB — CYTOLOGY - PAP
Comment: NEGATIVE
Diagnosis: NEGATIVE
High risk HPV: NEGATIVE

## 2019-11-08 LAB — LIPID PANEL
Chol/HDL Ratio: 3.6 ratio (ref 0.0–4.4)
Cholesterol, Total: 275 mg/dL — ABNORMAL HIGH (ref 100–199)
HDL: 77 mg/dL (ref >39)
LDL Chol Calc (NIH): 167 mg/dL — ABNORMAL HIGH (ref 0–99)
Triglycerides: 175 mg/dL — ABNORMAL HIGH (ref 0–149)
VLDL Cholesterol Cal: 31 mg/dL (ref 5–40)

## 2019-11-08 LAB — COMPREHENSIVE METABOLIC PANEL
ALT: 25 IU/L (ref 0–32)
AST: 17 IU/L (ref 0–40)
Albumin/Globulin Ratio: 1.7 (ref 1.2–2.2)
Albumin: 4.3 g/dL (ref 3.8–4.8)
Alkaline Phosphatase: 58 IU/L (ref 44–121)
BUN/Creatinine Ratio: 16 (ref 9–23)
BUN: 9 mg/dL (ref 6–20)
Bilirubin Total: 0.4 mg/dL (ref 0.0–1.2)
CO2: 24 mmol/L (ref 20–29)
Calcium: 9.4 mg/dL (ref 8.7–10.2)
Chloride: 99 mmol/L (ref 96–106)
Creatinine, Ser: 0.55 mg/dL — ABNORMAL LOW (ref 0.57–1.00)
GFR calc Af Amer: 138 mL/min/{1.73_m2} (ref >59)
GFR calc non Af Amer: 119 mL/min/{1.73_m2} (ref >59)
Globulin, Total: 2.5 g/dL (ref 1.5–4.5)
Glucose: 89 mg/dL (ref 65–99)
Potassium: 4 mmol/L (ref 3.5–5.2)
Sodium: 137 mmol/L (ref 134–144)
Total Protein: 6.8 g/dL (ref 6.0–8.5)

## 2020-11-02 ENCOUNTER — Inpatient Hospital Stay (HOSPITAL_COMMUNITY)
Admission: EM | Admit: 2020-11-02 | Discharge: 2020-11-06 | DRG: 101 | Disposition: A | Discharge status: Rehabilitation Facility | Payer: BC Managed Care – PPO | Source: Ambulatory Visit | Attending: Internal Medicine | Admitting: Internal Medicine

## 2020-11-02 ENCOUNTER — Emergency Department (HOSPITAL_COMMUNITY): Payer: BC Managed Care – PPO

## 2020-11-02 DIAGNOSIS — F17290 Nicotine dependence, other tobacco product, uncomplicated: Secondary | ICD-10-CM | POA: Diagnosis present

## 2020-11-02 DIAGNOSIS — E512 Wernicke's encephalopathy: Secondary | ICD-10-CM | POA: Diagnosis present

## 2020-11-02 DIAGNOSIS — Z79899 Other long term (current) drug therapy: Secondary | ICD-10-CM

## 2020-11-02 DIAGNOSIS — G40209 Localization-related (focal) (partial) symptomatic epilepsy and epileptic syndromes with complex partial seizures, not intractable, without status epilepticus: Secondary | ICD-10-CM | POA: Diagnosis present

## 2020-11-02 DIAGNOSIS — Z885 Allergy status to narcotic agent status: Secondary | ICD-10-CM

## 2020-11-02 DIAGNOSIS — Z20822 Contact with and (suspected) exposure to covid-19: Secondary | ICD-10-CM | POA: Diagnosis present

## 2020-11-02 DIAGNOSIS — H5500 Unspecified nystagmus: Secondary | ICD-10-CM | POA: Diagnosis present

## 2020-11-02 DIAGNOSIS — R531 Weakness: Secondary | ICD-10-CM

## 2020-11-02 DIAGNOSIS — F32A Depression, unspecified: Secondary | ICD-10-CM | POA: Diagnosis present

## 2020-11-02 DIAGNOSIS — F1020 Alcohol dependence, uncomplicated: Secondary | ICD-10-CM | POA: Diagnosis present

## 2020-11-02 DIAGNOSIS — R112 Nausea with vomiting, unspecified: Secondary | ICD-10-CM | POA: Diagnosis present

## 2020-11-02 DIAGNOSIS — Z91041 Radiographic dye allergy status: Secondary | ICD-10-CM

## 2020-11-02 DIAGNOSIS — R7401 Elevation of levels of liver transaminase levels: Secondary | ICD-10-CM | POA: Diagnosis present

## 2020-11-02 DIAGNOSIS — Z881 Allergy status to other antibiotic agents status: Secondary | ICD-10-CM

## 2020-11-02 DIAGNOSIS — G40109 Localization-related (focal) (partial) symptomatic epilepsy and epileptic syndromes with simple partial seizures, not intractable, without status epilepticus: Principal | ICD-10-CM | POA: Diagnosis present

## 2020-11-02 DIAGNOSIS — R260 Ataxic gait: Secondary | ICD-10-CM | POA: Diagnosis present

## 2020-11-02 DIAGNOSIS — E538 Deficiency of other specified B group vitamins: Secondary | ICD-10-CM | POA: Diagnosis present

## 2020-11-02 DIAGNOSIS — R270 Ataxia, unspecified: Secondary | ICD-10-CM

## 2020-11-02 DIAGNOSIS — R29898 Other symptoms and signs involving the musculoskeletal system: Principal | ICD-10-CM | POA: Diagnosis present

## 2020-11-02 DIAGNOSIS — D7589 Other specified diseases of blood and blood-forming organs: Secondary | ICD-10-CM | POA: Diagnosis present

## 2020-11-02 DIAGNOSIS — E876 Hypokalemia: Secondary | ICD-10-CM | POA: Diagnosis present

## 2020-11-02 LAB — I-STAT BETA HCG BLOOD, ED (MC, WL, AP ONLY): I-stat hCG, quantitative: 5.4 m[IU]/mL — ABNORMAL HIGH (ref <5)

## 2020-11-02 LAB — CBC WITH DIFFERENTIAL/PLATELET
Abs Immature Granulocytes: 0.01 10*3/uL (ref 0.00–0.07)
Basophils Absolute: 0.1 10*3/uL (ref 0.0–0.1)
Basophils Relative: 1 %
Eosinophils Absolute: 0.2 10*3/uL (ref 0.0–0.5)
Eosinophils Relative: 4 %
HCT: 43.9 % (ref 36.0–46.0)
Hemoglobin: 14.7 g/dL (ref 12.0–15.0)
Immature Granulocytes: 0 %
Lymphocytes Relative: 29 %
Lymphs Abs: 1.4 10*3/uL (ref 0.7–4.0)
MCH: 34.4 pg — ABNORMAL HIGH (ref 26.0–34.0)
MCHC: 33.5 g/dL (ref 30.0–36.0)
MCV: 102.8 fL — ABNORMAL HIGH (ref 80.0–100.0)
Monocytes Absolute: 0.5 10*3/uL (ref 0.1–1.0)
Monocytes Relative: 10 %
Neutro Abs: 2.6 10*3/uL (ref 1.7–7.7)
Neutrophils Relative %: 56 %
Platelets: 197 10*3/uL (ref 150–400)
RBC: 4.27 MIL/uL (ref 3.87–5.11)
RDW: 14.6 % (ref 11.5–15.5)
WBC: 4.7 10*3/uL (ref 4.0–10.5)
nRBC: 0 % (ref 0.0–0.2)

## 2020-11-02 LAB — I-STAT CHEM 8, ED
BUN: 6 mg/dL (ref 6–20)
Calcium, Ion: 1.23 mmol/L (ref 1.15–1.40)
Chloride: 101 mmol/L (ref 98–111)
Creatinine, Ser: 0.6 mg/dL (ref 0.44–1.00)
Glucose, Bld: 91 mg/dL (ref 70–99)
HCT: 47 % — ABNORMAL HIGH (ref 36.0–46.0)
Hemoglobin: 16 g/dL — ABNORMAL HIGH (ref 12.0–15.0)
Potassium: 4.3 mmol/L (ref 3.5–5.1)
Sodium: 137 mmol/L (ref 135–145)
TCO2: 27 mmol/L (ref 22–32)

## 2020-11-02 LAB — COMPREHENSIVE METABOLIC PANEL
ALT: 70 U/L — ABNORMAL HIGH (ref 0–44)
AST: 59 U/L — ABNORMAL HIGH (ref 15–41)
Albumin: 3.9 g/dL (ref 3.5–5.0)
Alkaline Phosphatase: 55 U/L (ref 38–126)
Anion gap: 10 (ref 5–15)
BUN: 8 mg/dL (ref 6–20)
CO2: 24 mmol/L (ref 22–32)
Calcium: 9.4 mg/dL (ref 8.9–10.3)
Chloride: 101 mmol/L (ref 98–111)
Creatinine, Ser: 0.6 mg/dL (ref 0.44–1.00)
GFR, Estimated: >60 mL/min (ref >60)
Glucose, Bld: 93 mg/dL (ref 70–99)
Potassium: 3.9 mmol/L (ref 3.5–5.1)
Sodium: 135 mmol/L (ref 135–145)
Total Bilirubin: 1 mg/dL (ref 0.3–1.2)
Total Protein: 7.2 g/dL (ref 6.5–8.1)

## 2020-11-02 LAB — RESP PANEL BY RT-PCR (FLU A&B, COVID) ARPGX2
Influenza A by PCR: NEGATIVE
Influenza B by PCR: NEGATIVE
SARS Coronavirus 2 by RT PCR: NEGATIVE

## 2020-11-02 LAB — VITAMIN B12: Vitamin B-12: 261 pg/mL (ref 180–914)

## 2020-11-02 LAB — ETHANOL: Alcohol, Ethyl (B): <10 mg/dL (ref <10)

## 2020-11-02 LAB — AMMONIA: Ammonia: 49 umol/L — ABNORMAL HIGH (ref 9–35)

## 2020-11-02 LAB — HCG, QUANTITATIVE, PREGNANCY: hCG, Beta Chain, Quant, S: <1 m[IU]/mL (ref <5)

## 2020-11-02 LAB — MAGNESIUM: Magnesium: 1.9 mg/dL (ref 1.7–2.4)

## 2020-11-02 MED ORDER — THIAMINE HCL 100 MG PO TABS: 100.0000 mg | ORAL_TABLET | Freq: Once | ORAL | Status: DC

## 2020-11-02 MED ORDER — LORAZEPAM 2 MG/ML IJ SOLN
0.0000 mg | Freq: Four times a day (QID) | INTRAMUSCULAR | Status: DC
Start: 2020-11-02 — End: 2020-11-03

## 2020-11-02 MED ORDER — LORAZEPAM 2 MG/ML IJ SOLN
1.0000 mg | Freq: Once | INTRAMUSCULAR | Status: AC | PRN
  Administered 2020-11-02: 1 mg via INTRAVENOUS
  Filled 2020-11-02: qty 1

## 2020-11-02 MED ORDER — THIAMINE HCL 100 MG PO TABS: 100.0000 mg | ORAL_TABLET | Freq: Every day | ORAL | Status: DC

## 2020-11-02 MED ORDER — THIAMINE HCL 100 MG/ML IJ SOLN
500.0000 mg | Freq: Once | INTRAMUSCULAR | Status: AC
  Administered 2020-11-02: 500 mg via INTRAVENOUS
  Filled 2020-11-02: qty 6

## 2020-11-02 MED ORDER — LACTATED RINGERS IV BOLUS
1000.0000 mL | Freq: Once | INTRAVENOUS | Status: AC
Start: 2020-11-02 — End: 2020-11-02
  Administered 2020-11-02: 1000 mL via INTRAVENOUS

## 2020-11-02 MED ORDER — LORAZEPAM 2 MG/ML IJ SOLN
0.0000 mg | Freq: Two times a day (BID) | INTRAMUSCULAR | Status: DC
Start: 2020-11-05 — End: 2020-11-03

## 2020-11-02 MED ORDER — GADOBUTROL 1 MMOL/ML IV SOLN
7.5000 mL | Freq: Once | INTRAVENOUS | Status: AC | PRN
  Administered 2020-11-02: 7.5 mL via INTRAVENOUS

## 2020-11-02 MED ORDER — LORAZEPAM 1 MG PO TABS: 0.0000 mg | ORAL_TABLET | Freq: Four times a day (QID) | ORAL | Status: DC

## 2020-11-02 MED ORDER — THIAMINE HCL 100 MG/ML IJ SOLN: 100.0000 mg | Freq: Every day | INTRAMUSCULAR | Status: DC

## 2020-11-02 MED ORDER — LORAZEPAM 1 MG PO TABS: 0.0000 mg | ORAL_TABLET | Freq: Two times a day (BID) | ORAL | Status: DC

## 2020-11-02 NOTE — ED Notes (Signed)
Labeled urine specimen and culture sent to lab. ENMiles 

## 2020-11-02 NOTE — ED Notes (Signed)
Pt. In MRI.

## 2020-11-02 NOTE — ED Provider Notes (Signed)
[facility]-EMERGENCY DEPT Provider Note   CSN: [id number] Arrival date & time: 11/02/20  1751     History Chief Complaint  Patient presents with   Weakness    Kelly Briggs is a 40 y.o. female.  HPI 40 year old female presents from [facility] with weakness/tremors and inability to walk in her legs.  She checked into their 4 days ago for alcohol detox.  She was started on new meds in addition to her chronic Lexapro, Lamictal.  Medication sheet from [facility] indicates she is also on Tegretol, Librium, Valium, as needed Zofran, thiamine and trazodone. No fevers or headache. Has upper extremity tremor but no weakness. No numbness.  Symptoms are diffuse throughout her legs and seem to be mostly tremor and difficulty with walking.  There is some weakness.  There is no numbness.  It is diffuse and did not start in her feet and progressively worked its way up.  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   Tachycardia 10/18/2009    Past Surgical History:  Procedure Laterality Date   APPENDECTOMY     BREAST BIOPSY Left 2008   Benign   BREAST BIOPSY Right 2008   Benign   COLPOSCOPY     LEEP  07/2013   HSIL   OVARIAN CYST REMOVAL  2016   URETERAL STENT PLACEMENT     URETEROLITHOTOMY       OB History     Gravida  0   Para  0   Term  0   Preterm  0   AB  0   Living  0      SAB  0   IAB  0   Ectopic  0   Multiple  0   Live Births  0           Family History  Problem Relation Age of Onset    Heart disease Father    Diabetes Maternal Grandmother     Social History   Tobacco Use   Smoking status: Every Day    Types: E-cigarettes   Smokeless tobacco: Never  Vaping Use   Vaping Use: Every day  Substance Use Topics   Alcohol use: No   Drug use: No    Home Medications Prior to Admission medications   Medication Sig Start Date End Date Taking? Authorizing Provider  Cholecalciferol (VITAMIN D3 PO) Take 1,000 Units by mouth daily. 10/25/18   [provider]  escitalopram (LEXAPRO) 10 MG tablet Take 10 mg by mouth. 10/08/11   [provider]  lamoTRIgine (LAMICTAL) 100 MG tablet take 2 tablets by mouth twice a day 08/14/15   [provider]  levonorgestrel (MIRENA) 20 MCG/24HR IUD 1 each by Intrauterine route once.    [provider]    Allergies    Iodinated diagnostic agents, Ciprofloxacin, Oxybutynin, Promethazine, and Oxycodone  Review of Systems   Review of Systems  Constitutional:  Negative for fever.  Gastrointestinal:  Positive for vomiting. Negative for abdominal pain  and diarrhea.  Musculoskeletal:  Negative for back pain and neck pain.  Neurological:  Positive for tremors and weakness. Negative for numbness and headaches.  All other systems reviewed and are negative.  Physical Exam Updated Vital Signs BP 104/77   Pulse 84   Temp 98.4 F (36.9 C) (Oral)   Resp (!) 22   Ht 5' 3.75" (1.619 m)   Wt 69.4 kg   SpO2 96%   BMI 26.47 kg/m   Physical Exam Vitals and nursing note reviewed.  Constitutional:      Appearance: She is well-developed.  HENT:     Head: Normocephalic and atraumatic.     Right Ear: External ear normal.     Left Ear: External ear normal.     Nose: Nose normal.  Eyes:     General:        Right eye: No discharge.        Left eye: No discharge.     Extraocular Movements: Extraocular movements intact.     Pupils: Pupils are equal, round, and reactive to light.  Cardiovascular:     Rate and  Rhythm: Normal rate and regular rhythm.     Heart sounds: Normal heart sounds.  Pulmonary:     Effort: Pulmonary effort is normal.     Breath sounds: Normal breath sounds.  Abdominal:     Palpations: Abdomen is soft.     Tenderness: There is no abdominal tenderness.  Skin:    General: Skin is warm and dry.  Neurological:     Mental Status: She is alert.     Deep Tendon Reflexes: Babinski sign absent on the right side. Babinski sign absent on the left side.     Reflex Scores:      Patellar reflexes are 3+ on the right side and 3+ on the left side.      Achilles reflexes are 2+ on the right side and 2+ on the left side.    Comments: CN 3-12 grossly intact. 5/5 strength in both upper extremities. 4/5 strength in BLE on the bed. However when she gets up she freely and seemingly easily moves her legs to get off the bed. Grossly normal sensation throughout. Normal finger to nose though with a tremor. When she walks she has a wider than normal gait, seems to have difficulty walking Questionable clonus  Psychiatric:        Mood and Affect: Mood is not anxious.    ED Results / Procedures / Treatments   Labs (all labs ordered are listed, but only abnormal results are displayed) Labs Reviewed  COMPREHENSIVE METABOLIC PANEL - Abnormal; Notable for the following components:      Result Value   AST 59 (*)    ALT 70 (*)    All other components within normal limits  CBC WITH DIFFERENTIAL/PLATELET - Abnormal; Notable for the following components:   MCV 102.8 (*)    MCH 34.4 (*)    All other components within normal limits  AMMONIA - Abnormal; Notable for the following components:   Ammonia 49 (*)    All other components within normal limits  I-STAT BETA HCG BLOOD, ED ([facility], [facility], [facility] ONLY) - Abnormal; Notable for the following components:   I-stat hCG, quantitative 5.4 (*)    All other components within normal limits  I-STAT CHEM 8, ED - Abnormal; Notable for the following components:    Hemoglobin 16.0 (*)    HCT 47.0 (*)    All other components  within normal limits  RESP PANEL BY RT-PCR (FLU A&B, COVID) ARPGX2  ETHANOL  VITAMIN B12  MAGNESIUM  HCG, QUANTITATIVE, PREGNANCY  RAPID URINE DRUG SCREEN, HOSP PERFORMED  URINALYSIS, ROUTINE W REFLEX MICROSCOPIC  LAMOTRIGINE LEVEL  CARBAMAZEPINE, FREE AND TOTAL  VITAMIN B1    EKG EKG Interpretation  Date/Time:  Friday November 02 2020 19:28:19 EDT Ventricular Rate:  72 PR Interval:  192 QRS Duration: 93 QT Interval:  392 QTC Calculation: 429 R Axis:   83 Text Interpretation: Sinus rhythm no acute ST/T changes No old tracing to compare Confirmed by Pricilla Loveless [phone]) on 11/02/2020 8:15:17 PM  Radiology MR Cervical Spine W or Wo Contrast  Result Date: 11/02/2020 CLINICAL DATA:  Bilateral lower extremity weakness with difficulty walking EXAM: MRI CERVICAL SPINE WITHOUT AND WITH CONTRAST TECHNIQUE: Multiplanar and multiecho pulse sequences of the cervical spine, to include the craniocervical junction and cervicothoracic junction, were obtained without and with intravenous contrast. CONTRAST:  7.5mL GADAVIST GADOBUTROL 1 MMOL/ML IV SOLN COMPARISON:  None. FINDINGS: Alignment: Physiologic. Vertebrae: No fracture, evidence of discitis, or bone lesion. Cord: Normal signal and morphology. Posterior Fossa, vertebral arteries, paraspinal tissues: Negative. Disc levels: There is no spinal canal or neural foraminal stenosis. IMPRESSION: Normal MRI of the cervical spine. Electronically Signed   By: Deatra Robinson M.D.   On: 11/02/2020 23:33   MR THORACIC SPINE W WO CONTRAST  Result Date: 11/02/2020 CLINICAL DATA:  Bilateral lower extremity weakness EXAM: MRI THORACIC WITHOUT AND WITH CONTRAST TECHNIQUE: Multiplanar and multiecho pulse sequences of the thoracic spine were obtained without and with intravenous contrast. CONTRAST:  7.45mL GADAVIST GADOBUTROL 1 MMOL/ML IV SOLN COMPARISON:  None. FINDINGS: Alignment:  Physiologic.  Vertebrae: No fracture, evidence of discitis, or bone lesion. Cord:  Normal signal and morphology. Paraspinal and other soft tissues: Negative. Disc levels: No spinal canal or neural foraminal stenosis. No abnormal contrast enhancement. IMPRESSION: Normal thoracic spine MRI. Electronically Signed   By: Deatra Robinson M.D.   On: 11/02/2020 23:35    Procedures Procedures   Medications Ordered in ED Medications  LORazepam (ATIVAN) injection 0-4 mg (has no administration in time range)    Or  LORazepam (ATIVAN) tablet 0-4 mg (has no administration in time range)  LORazepam (ATIVAN) injection 0-4 mg (has no administration in time range)    Or  LORazepam (ATIVAN) tablet 0-4 mg (has no administration in time range)  thiamine tablet 100 mg (has no administration in time range)    Or  thiamine (B-1) injection 100 mg (has no administration in time range)  lactated ringers bolus 1,000 mL (1,000 mLs Intravenous New Bag/Given 11/02/20 1905)  thiamine (B-1) injection 500 mg (500 mg Intravenous Given 11/02/20 2120)  LORazepam (ATIVAN) injection 1 mg (1 mg Intravenous Given 11/02/20 2120)  gadobutrol (GADAVIST) 1 MMOL/ML injection 7.5 mL (7.5 mLs Intravenous Contrast Given 11/02/20 2149)    ED Course  I have reviewed the triage vital signs and the nursing notes.  Pertinent labs & imaging results that were available during my care of the patient were reviewed by me and considered in my medical decision making (see chart for details).  Clinical Course as of 11/02/20 2358  Fri Nov 02, 2020  7322 I discussed with Dr. Amada Jupiter. He recommends adding mag, thiamine level (before giving 500 mg IV thiagmine), b12 level, and MRI C/T spine with and without contrast. Consult night neuro doc after these are done as patient will likely need in person eval tonight/tomorrow [SG]  Clinical Course User Index [SG] Pricilla Loveless, MD   MDM Rules/Calculators/A&P                           Patient's MRIs are  unremarkable.  Labs have not shown any obvious pathology to cause this weakness/difficulty with walking.  I discussed with Dr. Derry Lory, who recommends no further emergent work-up but because of her difficulty walking would agree with observation to the hospitalist service.  Does not have to go to [facility].  He advises putting on CIWA and treating for this.  Probably stop the Lexapro as this could cause some hyperreflexia.  Needs PT/OT.  No specific neuro consult unless the symptoms do not improve.  Discussed with Dr. Toniann Fail for admission. Final Clinical Impression(s) / ED Diagnoses Final diagnoses:  Weakness of both lower extremities    Rx / DC Orders ED Discharge Orders     None        Pricilla Loveless, MD 11/02/20 2359

## 2020-11-02 NOTE — ED Triage Notes (Signed)
[facility] brings pt in from [facility]. Pt was there for detox. Pt states 2 days ago her legs become weak and unsteady. Pt is able to stand but too shaky to walk per staff.

## 2020-11-03 ENCOUNTER — Encounter (HOSPITAL_COMMUNITY): Payer: Self-pay | Admitting: Internal Medicine

## 2020-11-03 ENCOUNTER — Other Ambulatory Visit: Payer: Self-pay

## 2020-11-03 DIAGNOSIS — R29898 Other symptoms and signs involving the musculoskeletal system: Secondary | ICD-10-CM | POA: Diagnosis not present

## 2020-11-03 DIAGNOSIS — F1010 Alcohol abuse, uncomplicated: Secondary | ICD-10-CM

## 2020-11-03 DIAGNOSIS — G40209 Localization-related (focal) (partial) symptomatic epilepsy and epileptic syndromes with complex partial seizures, not intractable, without status epilepticus: Secondary | ICD-10-CM

## 2020-11-03 LAB — COMPREHENSIVE METABOLIC PANEL
ALT: 56 U/L — ABNORMAL HIGH (ref 0–44)
AST: 42 U/L — ABNORMAL HIGH (ref 15–41)
Albumin: 3 g/dL — ABNORMAL LOW (ref 3.5–5.0)
Alkaline Phosphatase: 41 U/L (ref 38–126)
Anion gap: 4 — ABNORMAL LOW (ref 5–15)
BUN: 6 mg/dL (ref 6–20)
CO2: 28 mmol/L (ref 22–32)
Calcium: 8.5 mg/dL — ABNORMAL LOW (ref 8.9–10.3)
Chloride: 106 mmol/L (ref 98–111)
Creatinine, Ser: 0.67 mg/dL (ref 0.44–1.00)
GFR, Estimated: >60 mL/min (ref >60)
Glucose, Bld: 119 mg/dL — ABNORMAL HIGH (ref 70–99)
Potassium: 3.1 mmol/L — ABNORMAL LOW (ref 3.5–5.1)
Sodium: 138 mmol/L (ref 135–145)
Total Bilirubin: 0.8 mg/dL (ref 0.3–1.2)
Total Protein: 5.4 g/dL — ABNORMAL LOW (ref 6.5–8.1)

## 2020-11-03 LAB — URINALYSIS, ROUTINE W REFLEX MICROSCOPIC
Bilirubin Urine: NEGATIVE
Glucose, UA: NEGATIVE mg/dL
Hgb urine dipstick: NEGATIVE
Ketones, ur: 5 mg/dL — AB
Leukocytes,Ua: NEGATIVE
Nitrite: NEGATIVE
Protein, ur: NEGATIVE mg/dL
Specific Gravity, Urine: 1.008 (ref 1.005–1.030)
pH: 7 (ref 5.0–8.0)

## 2020-11-03 LAB — CBC
HCT: 40.3 % (ref 36.0–46.0)
Hemoglobin: 13.3 g/dL (ref 12.0–15.0)
MCH: 34.5 pg — ABNORMAL HIGH (ref 26.0–34.0)
MCHC: 33 g/dL (ref 30.0–36.0)
MCV: 104.7 fL — ABNORMAL HIGH (ref 80.0–100.0)
Platelets: 171 10*3/uL (ref 150–400)
RBC: 3.85 MIL/uL — ABNORMAL LOW (ref 3.87–5.11)
RDW: 14.6 % (ref 11.5–15.5)
WBC: 4.2 10*3/uL (ref 4.0–10.5)
nRBC: 0 % (ref 0.0–0.2)

## 2020-11-03 LAB — RAPID URINE DRUG SCREEN, HOSP PERFORMED
Amphetamines: NOT DETECTED
Barbiturates: NOT DETECTED
Benzodiazepines: POSITIVE — AB
Cocaine: NOT DETECTED
Opiates: NOT DETECTED
Tetrahydrocannabinol: NOT DETECTED

## 2020-11-03 LAB — LACTIC ACID, PLASMA: Lactic Acid, Venous: 1.6 mmol/L (ref 0.5–1.9)

## 2020-11-03 LAB — PHOSPHORUS: Phosphorus: 2.2 mg/dL — ABNORMAL LOW (ref 2.5–4.6)

## 2020-11-03 LAB — HIV ANTIBODY (ROUTINE TESTING W REFLEX): HIV Screen 4th Generation wRfx: NONREACTIVE

## 2020-11-03 LAB — MAGNESIUM: Magnesium: 1.7 mg/dL (ref 1.7–2.4)

## 2020-11-03 MED ORDER — SODIUM CHLORIDE 0.9 % IV BOLUS
500.0000 mL | Freq: Once | INTRAVENOUS | Status: AC
  Administered 2020-11-03: 500 mL via INTRAVENOUS

## 2020-11-03 MED ORDER — SODIUM CHLORIDE 0.9 % IV SOLN: INTRAVENOUS | Status: DC

## 2020-11-03 MED ORDER — FOLIC ACID 1 MG PO TABS
1.0000 mg | ORAL_TABLET | Freq: Every day | ORAL | Status: DC
  Administered 2020-11-03 – 2020-11-06 (×4): 1 mg via ORAL
  Filled 2020-11-03 (×4): qty 1

## 2020-11-03 MED ORDER — LACTULOSE 10 GM/15ML PO SOLN
20.0000 g | Freq: Two times a day (BID) | ORAL | Status: DC
  Administered 2020-11-03 – 2020-11-04 (×2): 20 g via ORAL
  Filled 2020-11-03 (×3): qty 30

## 2020-11-03 MED ORDER — PANTOPRAZOLE SODIUM 40 MG PO TBEC
40.0000 mg | DELAYED_RELEASE_TABLET | Freq: Every day | ORAL | Status: DC
  Administered 2020-11-03 – 2020-11-06 (×4): 40 mg via ORAL
  Filled 2020-11-03 (×4): qty 1

## 2020-11-03 MED ORDER — ADULT MULTIVITAMIN W/MINERALS CH
1.0000 | ORAL_TABLET | Freq: Every day | ORAL | Status: DC
  Administered 2020-11-03 – 2020-11-06 (×4): 1 via ORAL
  Filled 2020-11-03 (×4): qty 1

## 2020-11-03 MED ORDER — CYANOCOBALAMIN 1000 MCG/ML IJ SOLN
1000.0000 ug | Freq: Once | INTRAMUSCULAR | Status: AC
  Administered 2020-11-03: 1000 ug via INTRAMUSCULAR
  Filled 2020-11-03: qty 1

## 2020-11-03 MED ORDER — LORAZEPAM 2 MG/ML IJ SOLN
1.0000 mg | INTRAMUSCULAR | Status: AC | PRN
  Administered 2020-11-04: 1 mg via INTRAVENOUS
  Filled 2020-11-03: qty 1

## 2020-11-03 MED ORDER — SODIUM PHOSPHATES 45 MMOLE/15ML IV SOLN
15.0000 mmol | Freq: Once | INTRAVENOUS | Status: AC
  Administered 2020-11-03: 15 mmol via INTRAVENOUS
  Filled 2020-11-03: qty 5

## 2020-11-03 MED ORDER — POTASSIUM CHLORIDE CRYS ER 20 MEQ PO TBCR
40.0000 meq | EXTENDED_RELEASE_TABLET | ORAL | Status: AC
Start: 2020-11-03 — End: 2020-11-03
  Administered 2020-11-03 (×3): 40 meq via ORAL
  Filled 2020-11-03 (×3): qty 2

## 2020-11-03 MED ORDER — LAMOTRIGINE 100 MG PO TABS
200.0000 mg | ORAL_TABLET | Freq: Two times a day (BID) | ORAL | Status: DC
  Administered 2020-11-03 – 2020-11-06 (×7): 200 mg via ORAL
  Filled 2020-11-03 (×7): qty 2

## 2020-11-03 MED ORDER — LORAZEPAM 1 MG PO TABS
1.0000 mg | ORAL_TABLET | ORAL | Status: AC | PRN
Start: 2020-11-03 — End: 2020-11-06
  Administered 2020-11-05: 1 mg via ORAL
  Filled 2020-11-03: qty 1

## 2020-11-03 MED ORDER — THIAMINE HCL 100 MG/ML IJ SOLN
500.0000 mg | Freq: Three times a day (TID) | INTRAVENOUS | Status: AC
  Administered 2020-11-03 – 2020-11-05 (×9): 500 mg via INTRAVENOUS
  Filled 2020-11-03 (×9): qty 5

## 2020-11-03 MED ORDER — MAGNESIUM SULFATE 2 GM/50ML IV SOLN
2.0000 g | Freq: Once | INTRAVENOUS | Status: AC
  Administered 2020-11-03: 2 g via INTRAVENOUS
  Filled 2020-11-03: qty 50

## 2020-11-03 MED ORDER — POTASSIUM CHLORIDE IN NACL 40-0.9 MEQ/L-% IV SOLN
INTRAVENOUS | Status: DC
  Filled 2020-11-03 (×2): qty 1000

## 2020-11-03 MED ORDER — THIAMINE HCL 100 MG PO TABS
100.0000 mg | ORAL_TABLET | Freq: Every day | ORAL | Status: DC
  Administered 2020-11-06: 100 mg via ORAL
  Filled 2020-11-03: qty 1

## 2020-11-03 NOTE — Evaluation (Signed)
Occupational Therapy Evaluation Patient Details Name: Kelly Briggs MRN: [id number] DOB: 1980/03/28 Today's Date: 11/03/2020   History of Present Illness Kelly Briggs is a 40 y.o. female with history of alcohol abuse and seizures was admitted to the alcohol rehab on October 29, 2020 about 5 days ago.  Patient last drink was then.  The following day patient started out of increasing weakness particularly lower extremity with difficulty ambulating.  Patient as per the report was also started on medications at the rehab including carbamazepine.  Due to persistent weakness and increasing tremors patient was transferred to [facility].   Clinical Impression   Patient is a 40 year old female who was admitted for above. Patient reported living at home alone prior level. Currently, Patient was noted to require increased assistance for transfers and all standing tasks with noted decreased standing balance. Patient would continue to benefit from skilled OT services at this time while admitted and after d/c to address noted deficits in order to improve overall safety and independence in ADLs.        Recommendations for follow up therapy are one component of a multi-disciplinary discharge planning process, led by the attending physician.  Recommendations may be updated based on patient status, additional functional criteria and insurance authorization.   Follow Up Recommendations  Home health OT pending progress   Equipment Recommendations  None recommended by OT    Recommendations for Other Services       Precautions / Restrictions Precautions Precautions: Fall Restrictions Weight Bearing Restrictions: No      Mobility Bed Mobility Overal bed mobility: Needs Assistance Bed Mobility: Supine to Sit     Supine to sit: Supervision     General bed mobility comments: with increased time    Transfers                      Balance Overall balance assessment: Needs  assistance Sitting-balance support: Feet supported Sitting balance-Leahy Scale: Good                                     ADL either performed or assessed with clinical judgement   ADL Overall ADL's : Needs assistance/impaired Eating/Feeding: Set up;Sitting   Grooming: Oral care;Wash/dry face;Sitting;Set up Grooming Details (indicate cue type and reason): EOB. patient compelted shampoo cap seated on edge ob ed with no LOB. Upper Body Bathing: Set up;Sitting Upper Body Bathing Details (indicate cue type and reason): EOB Lower Body Bathing: Min guard;Sitting/lateral leans Lower Body Bathing Details (indicate cue type and reason): EOB with increased time         Toilet Transfer: Moderate assistance;RW;BSC   Toileting- Architect and Hygiene: Min guard;Sitting/lateral lean               Vision Baseline Vision/History: 1 Wears glasses Ability to See in Adequate Light: 1 Impaired       Perception     Praxis      Pertinent Vitals/Pain Pain Assessment: No/denies pain     Hand Dominance Right   Extremity/Trunk Assessment Upper Extremity Assessment Upper Extremity Assessment: Overall WFL for tasks assessed   Lower Extremity Assessment Lower Extremity Assessment: Defer to PT evaluation   Cervical / Trunk Assessment Cervical / Trunk Assessment: Normal   Communication Communication Communication: No difficulties   Cognition Arousal/Alertness: Awake/alert Behavior During Therapy: Flat affect Overall Cognitive Status: No family/caregiver present to determine baseline  cognitive functioning                               Problem Solving: Slow processing General Comments: oriented to self, place and date.   General Comments       Exercises     Shoulder Instructions      Home Living Family/patient expects to be discharged to:: Private residence Living Arrangements: Alone Available Help at Discharge: Friend(s) (if  needed) Type of Home: House Home Access: Stairs to enter [employer] of Steps: 6 Entrance Stairs-Rails: Right;Left Home Layout: One level     Bathroom Shower/Tub: Tub/shower unit         Home Equipment: None          Prior Functioning/Environment Level of Independence: Independent                 OT Problem List: Decreased strength;Decreased activity tolerance;Impaired balance (sitting and/or standing);Decreased safety awareness;Decreased knowledge of use of DME or AE      OT Treatment/Interventions: Self-care/ADL training;Therapeutic exercise;Energy conservation;DME and/or AE instruction;Therapeutic activities;Balance training;Patient/family education    OT Goals(Current goals can be found in the care plan section) Acute Rehab OT Goals Patient Stated Goal: to have a wheelchair to be able to participate in toileting tasks OT Goal Formulation: With patient Time For Goal Achievement: 11/17/20 Potential to Achieve Goals: Good  OT Frequency: Min 2X/week   Barriers to D/C: Decreased caregiver support  patient lives at home alone. [facility]       Co-evaluation              AM-PAC OT "6 Clicks" Daily Activity     Outcome Measure Help from another person eating meals?: A Little Help from another person taking care of personal grooming?: A Little Help from another person toileting, which includes using toliet, bedpan, or urinal?: A Lot Help from another person bathing (including washing, rinsing, drying)?: A Little (seated) Help from another person to put on and taking off regular upper body clothing?: A Little Help from another person to put on and taking off regular lower body clothing?: A Lot 6 Click Score: 16   End of Session Nurse Communication: Mobility status  Activity Tolerance: Patient tolerated treatment well Patient left: in bed;with call bell/phone within reach;with nursing/sitter in  room  OT Visit Diagnosis: Unsteadiness on feet (R26.81);Other abnormalities of gait and mobility (R26.89);Muscle weakness (generalized) (M62.81)                Time: 7824-2353 OT Time Calculation (min): 29 min Charges:  OT General Charges $OT Visit: 1 Visit OT Evaluation $OT Eval Low Complexity: 1 Low OT Treatments $Self Care/Home Management : 8-22 mins  Sharyn Blitz OTR/L, MS Acute Rehabilitation Department Office# [phone] Pager# [phone]   [facility] Guest Sherron Mummert 11/03/2020, 3:21 PM

## 2020-11-03 NOTE — Plan of Care (Signed)
°  Problem: Health Behavior/Discharge Planning: °Goal: Ability to manage health-related needs will improve °Outcome: Progressing °  °Problem: Clinical Measurements: °Goal: Diagnostic test results will improve °Outcome: Progressing °  °Problem: Activity: °Goal: Risk for activity intolerance will decrease °Outcome: Progressing °  °Problem: Coping: °Goal: Level of anxiety will decrease °Outcome: Progressing °  °

## 2020-11-03 NOTE — Evaluation (Signed)
Physical Therapy Evaluation Patient Details Name: Kelly Briggs MRN: [id number] DOB: [birth year] Today's Date: 11/03/2020  History of Present Illness  Kelly Briggs is a 40 y.o. female with history of alcohol abuse and seizures was admitted to the alcohol rehab on October 29, 2020 about 5 days ago.  Patient last drink was then.  The following day patient started out of increasing weakness particularly lower extremity with difficulty ambulating.  Patient as per the report was also started on medications at the rehab including carbamazepine.  Due to persistent weakness and increasing tremors patient was transferred to [facility].  Clinical Impression  Pt admitted with above diagnosis. Pt currently with functional limitations due to the deficits listed below (see PT Problem List). Pt will benefit from skilled PT to increase their independence and safety with mobility to allow discharge to the venue listed below.  Pt asking about w/c for independence at [facility].  Will continue to assess her progress.  She may need HHPT as well, depending on her progress.        Recommendations for follow up therapy are one component of a multi-disciplinary discharge planning process, led by the attending physician.  Recommendations may be updated based on patient status, additional functional criteria and insurance authorization.  Follow Up Recommendations Home health PT (will continue to assess)    Equipment Recommendations  Other (comment);Wheelchair (measurements PT) (possibly?, will continue to assess her progress)    Recommendations for Other Services OT consult     Precautions / Restrictions Precautions Precautions: Fall Restrictions Weight Bearing Restrictions: No      Mobility  Bed Mobility Overal bed mobility: Needs Assistance Bed Mobility: Supine to Sit     Supine to sit: Supervision          Transfers Overall transfer level: Needs assistance Equipment used: 2 person  hand held assist Transfers: Sit to/from Stand Sit to Stand: Min assist;+2 physical assistance            Ambulation/Gait Ambulation/Gait assistance: Min assist;Mod assist;+2 physical assistance Gait Distance (Feet): 15 Feet Assistive device: Rolling walker (2 wheeled);2 person hand held assist;None Gait Pattern/deviations: Ataxic;Wide base of support;Trunk flexed Gait velocity: decreased   General Gait Details: She walked without any AD initially to show her gait pattern, then gave RW and she had a lot of difficulty pushing the RW, then ambulate with HHA of 2.  Gait pattern without AD and with HHA was similiar with wide BOS and difficulty with foot placement.  With RW, she couldn't coordination of pushing it and steps.  Stairs            Wheelchair Mobility    Modified Rankin (Stroke Patients Only)       Balance Overall balance assessment: Needs assistance   Sitting balance-Leahy Scale: Good       Standing balance-Leahy Scale: Poor                               Pertinent Vitals/Pain Pain Assessment: No/denies pain    Home Living Family/patient expects to be discharged to:: Private residence Living Arrangements: Alone   Type of Home: House Home Access: Stairs to enter Entrance Stairs-Rails: Doctor, general practice of Steps: 6 Home Layout: One level Home Equipment: None Additional Comments: this information is based on her home environment.  She may be going back to the [facility] at time of dc.    Prior Function Level of Independence: Independent  Comments: Has been using a w/c the last 3 days due to weakness     Hand Dominance        Extremity/Trunk Assessment   Upper Extremity Assessment Upper Extremity Assessment: Overall WFL for tasks assessed (some tremors noted)    Lower Extremity Assessment Lower Extremity Assessment: RLE deficits/detail;LLE deficits/detail RLE Deficits / Details: hip flex 3-/5,  quads 3+/5, hams 3-/5 RLE Sensation:  (intact but reports they feel different) RLE Coordination: decreased gross motor LLE Deficits / Details: hip flex 3-/5, quads 3+/5, hams 3-/5 LLE Sensation:  (intact but reports they feel different) LLE Coordination: decreased gross motor       Communication      Cognition Arousal/Alertness: Awake/alert Behavior During Therapy: Flat affect Overall Cognitive Status: No family/caregiver present to determine baseline cognitive functioning Area of Impairment: Problem solving                             Problem Solving: Slow processing        General Comments      Exercises     Assessment/Plan    PT Assessment Patient needs continued PT services  PT Problem List Decreased strength;Decreased mobility       PT Treatment Interventions DME instruction;Gait training;Functional mobility training;Balance training;Therapeutic exercise;Therapeutic activities    PT Goals (Current goals can be found in the Care Plan section)  Acute Rehab PT Goals Patient Stated Goal: interested in a w/c PT Goal Formulation: With patient Time For Goal Achievement: 11/17/20 Potential to Achieve Goals: Good    Frequency Min 3X/week   Barriers to discharge Decreased caregiver support      Co-evaluation               AM-PAC PT "6 Clicks" Mobility  Outcome Measure Help needed turning from your back to your side while in a flat bed without using bedrails?: None Help needed moving from lying on your back to sitting on the side of a flat bed without using bedrails?: None Help needed moving to and from a bed to a chair (including a wheelchair)?: A Little Help needed standing up from a chair using your arms (e.g., wheelchair or bedside chair)?: A Little Help needed to walk in hospital room?: A Lot Help needed climbing 3-5 steps with a railing? : A Lot 6 Click Score: 18    End of Session Equipment Utilized During Treatment: Gait belt Activity  Tolerance: Patient tolerated treatment well Patient left: in chair;with call bell/phone within reach;with chair alarm set Nurse Communication: Mobility status      Time: 6468-0321 PT Time Calculation (min) (ACUTE ONLY): 24 min   Charges:   PT Evaluation $PT Eval Moderate Complexity: 1 Mod PT Treatments $Gait Training: 8-22 mins        Kelly Briggs, PT  11/03/2020   Kelly Briggs 11/03/2020, 10:56 AM

## 2020-11-03 NOTE — H&P (Addendum)
History and Physical    Kelly Briggs QMV:[id number] DOB: [birth year] DOA: 11/02/2020  PCP: Patient, No Pcp Per (Inactive)  Patient coming from: Alcohol rehab.  Chief Complaint: Lower extremity weakness.  HPI: Kelly Briggs is a 40 y.o. female with history of alcohol abuse and seizures was admitted to the alcohol rehab on October 29, 2020 about 5 days ago.  Patient last drink was then.  The following day patient started out of increasing weakness particularly lower extremity with difficulty ambulating.  Patient as per the report was also started on medications at the rehab including carbamazepine.  Due to persistent weakness and increasing tremors patient was transferred to [facility].  Denies any incontinence of urine or bowel or any tingling or numbness of the extremities.  Did not have any headache fever chills diarrhea but did have some nausea vomiting.  Denies any abdominal pain.  ED Course: In the hospital patient was noticed to have good deep tendon reflexes.  Labs show macrocytosis with normal hemoglobin AST and ALT were mildly elevated at 59 and 70.  Total bilirubin was 1.  Neurologist on-call Dr. Amada Jupiter was consulted by the ER physician who recommended getting MRI C-spine and T-spine which were unremarkable.  At this point they recommended getting carbamazepine level Lamictal level B12 and thiamine level.  Neurologist was reconsulted by the ER physician at this time Dr. Terrilee Files requested admission for further observation and getting patient on CIWA protocol.  B12 levels came back 261.  On my exam patient is generally weak difficult to walk because of unsteadiness.  COVID test was negative.  Review of Systems: As per HPI, rest all negative.   Past Medical History:  Diagnosis Date   Anemia    Anxiety    Depression    History of alcohol abuse    History of kidney stones    Seizure disorder (HCC)    Substance abuse (HCC)     Past Surgical History:  Procedure Laterality  Date   APPENDECTOMY     BREAST BIOPSY Left 2008   Benign   BREAST BIOPSY Right 2008   Benign   COLPOSCOPY     LEEP  07/2013   HSIL   OVARIAN CYST REMOVAL  2016   URETERAL STENT PLACEMENT     URETEROLITHOTOMY       reports that she has been smoking e-cigarettes. She has never used smokeless tobacco. She reports that she does not drink alcohol and does not use drugs.  Allergies  Allergen Reactions   Iodinated Diagnostic Agents Hives   Ciprofloxacin Other (See Comments)   Oxybutynin     anticholinergic    Promethazine Other (See Comments)   Oxycodone Anxiety    Family History  Problem Relation Age of Onset   Heart disease Father    Diabetes Maternal Grandmother     Prior to Admission medications   Medication Sig Start Date End Date Taking? Authorizing Provider  Cholecalciferol (VITAMIN D3 PO) Take 1,000 Units by mouth daily. 10/25/18   [provider]  escitalopram (LEXAPRO) 10 MG tablet Take 10 mg by mouth. 10/08/11   [provider]  lamoTRIgine (LAMICTAL) 100 MG tablet take 2 tablets by mouth twice a day 08/14/15   [provider]  levonorgestrel (MIRENA) 20 MCG/24HR IUD 1 each by Intrauterine route once.    [provider]    Physical Exam: Constitutional: Moderately built and nourished. Vitals:   11/02/20 1802 11/02/20 1804 11/02/20 2319  BP: (!) 129/98  104/77  Pulse: 77  84  Resp: 18  (!) 22  Temp: 98.4 F (36.9 C)    TempSrc: Oral    SpO2: 95% 97% 96%  Weight: 69.4 kg    Height: 5' 3.75" (1.619 m)     Eyes: Anicteric no pallor. ENMT: No discharge from the ears eyes nose and mouth: Neck: No mass felt.  No neck rigidity. Respiratory: No rhonchi or crepitations. Cardiovascular: S1-S2 heard. Abdomen: Soft nontender bowel sound present. Musculoskeletal: No edema. Skin: No rash. Neurologic: Alert awake oriented to time place and person.  Moving all extremities 5 x 5.  On trying to ambulate patient is finding it very  unsteady.  Has good deep and reflexes.  Has good sensation to touch and pain. Psychiatric: Appears normal.  Normal affect.   Labs on Admission: I have personally reviewed following labs and imaging studies  CBC: Recent Labs  Lab 11/02/20 1902 11/02/20 1909  WBC  --  4.7  NEUTROABS  --  2.6  HGB 16.0* 14.7  HCT 47.0* 43.9  MCV  --  102.8*  PLT  --  197   Basic Metabolic Panel: Recent Labs  Lab 11/02/20 1902 11/02/20 1909 11/02/20 2100  NA 137 135  --   K 4.3 3.9  --   CL 101 101  --   CO2  --  24  --   GLUCOSE 91 93  --   BUN 6 8  --   CREATININE 0.60 0.60  --   CALCIUM  --  9.4  --   MG  --   --  1.9   GFR: Estimated Creatinine Clearance: 89.7 mL/min (by C-G formula based on SCr of 0.6 mg/dL). Liver Function Tests: Recent Labs  Lab 11/02/20 1909  AST 59*  ALT 70*  ALKPHOS 55  BILITOT 1.0  PROT 7.2  ALBUMIN 3.9   No results for input(s): LIPASE, AMYLASE in the last 168 hours. Recent Labs  Lab 11/02/20 2130  AMMONIA 49*   Coagulation Profile: No results for input(s): INR, PROTIME in the last 168 hours. Cardiac Enzymes: No results for input(s): CKTOTAL, CKMB, CKMBINDEX, TROPONINI in the last 168 hours. BNP (last 3 results) No results for input(s): PROBNP in the last 8760 hours. HbA1C: No results for input(s): HGBA1C in the last 72 hours. CBG: No results for input(s): GLUCAP in the last 168 hours. Lipid Profile: No results for input(s): CHOL, HDL, LDLCALC, TRIG, CHOLHDL, LDLDIRECT in the last 72 hours. Thyroid Function Tests: No results for input(s): TSH, T4TOTAL, FREET4, T3FREE, THYROIDAB in the last 72 hours. Anemia Panel: Recent Labs    11/02/20 2100  VITAMINB12 261   Urine analysis:    Component Value Date/Time   COLORURINE YELLOW 11/02/2020 2316   APPEARANCEUR CLEAR 11/02/2020 2316   LABSPEC 1.008 11/02/2020 2316   PHURINE 7.0 11/02/2020 2316   GLUCOSEU NEGATIVE 11/02/2020 2316   HGBUR NEGATIVE 11/02/2020 2316   BILIRUBINUR NEGATIVE  11/02/2020 2316   KETONESUR 5 (A) 11/02/2020 2316   PROTEINUR NEGATIVE 11/02/2020 2316   NITRITE NEGATIVE 11/02/2020 2316   LEUKOCYTESUR NEGATIVE 11/02/2020 2316   Sepsis Labs: @LABRCNTIP (procalcitonin:4,lacticidven:4) ) Recent Results (from the past 240 hour(s))  Resp Panel by RT-PCR (Flu A&B, Covid) Nasopharyngeal Swab     Status: None   Collection Time: 11/02/20  8:47 PM   Specimen: Nasopharyngeal Swab; Nasopharyngeal(NP) swabs in vial transport medium  Result Value Ref Range Status   SARS Coronavirus 2 by RT PCR NEGATIVE NEGATIVE Final    Comment: (NOTE)  SARS-CoV-2 target nucleic acids are NOT DETECTED.  The SARS-CoV-2 RNA is generally detectable in upper respiratory specimens during the acute phase of infection. The lowest concentration of SARS-CoV-2 viral copies this assay can detect is 138 copies/mL. A negative result does not preclude SARS-Cov-2 infection and should not be used as the sole basis for treatment or other patient management decisions. A negative result may occur with  improper specimen collection/handling, submission of specimen other than nasopharyngeal swab, presence of viral mutation(s) within the areas targeted by this assay, and inadequate number of viral copies(<138 copies/mL). A negative result must be combined with clinical observations, patient history, and epidemiological information. The expected result is Negative.  Fact Sheet for Patients:  BloggerCourse.com  Fact Sheet for Healthcare Providers:  SeriousBroker.it  This test is no t yet approved or cleared by the [place] FDA and  has been authorized for detection and/or diagnosis of SARS-CoV-2 by FDA under an Emergency Use Authorization (EUA). This EUA will remain  in effect (meaning this test can be used) for the duration of the COVID-19 declaration under Section 564(b)(1) of the Act, 21 U.S.C.section 360bbb-3(b)(1), unless the  authorization is terminated  or revoked sooner.       Influenza A by PCR NEGATIVE NEGATIVE Final   Influenza B by PCR NEGATIVE NEGATIVE Final    Comment: (NOTE) The Xpert Xpress SARS-CoV-2/FLU/RSV plus assay is intended as an aid in the diagnosis of influenza from Nasopharyngeal swab specimens and should not be used as a sole basis for treatment. Nasal washings and aspirates are unacceptable for Xpert Xpress SARS-CoV-2/FLU/RSV testing.  Fact Sheet for Patients: BloggerCourse.com  Fact Sheet for Healthcare Providers: SeriousBroker.it  This test is not yet approved or cleared by the [place] FDA and has been authorized for detection and/or diagnosis of SARS-CoV-2 by FDA under an Emergency Use Authorization (EUA). This EUA will remain in effect (meaning this test can be used) for the duration of the COVID-19 declaration under Section 564(b)(1) of the Act, 21 U.S.C. section 360bbb-3(b)(1), unless the authorization is terminated or revoked.  Performed at [facility], [address], [place], [place] [address]      Radiological Exams on Admission: MR Cervical Spine W or Wo Contrast  Result Date: 11/02/2020 CLINICAL DATA:  Bilateral lower extremity weakness with difficulty walking EXAM: MRI CERVICAL SPINE WITHOUT AND WITH CONTRAST TECHNIQUE: Multiplanar and multiecho pulse sequences of the cervical spine, to include the craniocervical junction and cervicothoracic junction, were obtained without and with intravenous contrast. CONTRAST:  7.61mL GADAVIST GADOBUTROL 1 MMOL/ML IV SOLN COMPARISON:  None. FINDINGS: Alignment: Physiologic. Vertebrae: No fracture, evidence of discitis, or bone lesion. Cord: Normal signal and morphology. Posterior Fossa, vertebral arteries, paraspinal tissues: Negative. Disc levels: There is no spinal canal or neural foraminal stenosis. IMPRESSION: Normal MRI of the cervical spine.  Electronically Signed   By: Deatra Robinson M.D.   On: 11/02/2020 23:33   MR THORACIC SPINE W WO CONTRAST  Result Date: 11/02/2020 CLINICAL DATA:  Bilateral lower extremity weakness EXAM: MRI THORACIC WITHOUT AND WITH CONTRAST TECHNIQUE: Multiplanar and multiecho pulse sequences of the thoracic spine were obtained without and with intravenous contrast. CONTRAST:  7.27mL GADAVIST GADOBUTROL 1 MMOL/ML IV SOLN COMPARISON:  None. FINDINGS: Alignment:  Physiologic. Vertebrae: No fracture, evidence of discitis, or bone lesion. Cord:  Normal signal and morphology. Paraspinal and other soft tissues: Negative. Disc levels: No spinal canal or neural foraminal stenosis. No abnormal contrast enhancement. IMPRESSION: Normal thoracic spine MRI. Electronically Signed  By: Deatra Robinson M.D.   On: 11/02/2020 23:35    EKG: Independently reviewed.  Normal sinus rhythm.  Assessment/Plan Principal Problem:   Lower extremity weakness Active Problems:   Partial epilepsy with impairment of consciousness (HCC)   Weakness    Generalized weakness with difficulty ambulating with blood pressure is in the low normal- I think patient's symptoms may be partially contributed by having low normal blood pressure for which I have ordered fluid bolus.  Will repeat labs and check lactic acid level.  At this time neurologist recommended getting Lamictal levels carbamazepine levels thiamine levels and B12 levels.  B12 levels came back at 261.  Patient has been placed on high-dose thiamine.  And CIWA protocol.  Get physical therapy consult.  If symptoms do not improve reconsult neurology. Alcohol abuse presently on CIWA protocol see #1.  Thiamine. History of seizures on Lamictal.  Patient has been taking Lamictal since 2012 as per the patient.  Lamictal levels are pending. Macrocytosis likely from alcoholism.  Check folate levels.  B12 levels at 161. Elevated LFTs likely from alcoholism.  Follow LFTs.  May check acute hepatitis panel  with next blood draw.   DVT prophylaxis: SCDs.  Avoiding anticoagulation in anticipation if needed procedure like lumbar puncture. Code Status: Full code. Family Communication: Discussed with patient. Disposition Plan: To be determined. Consults called: ER physician discussed with neurologist. Admission status: Observation.   Eduard Clos MD [facility] Pager [phone].  If 7PM-7AM, please contact night-coverage [email or web address] Password [facility]  11/03/2020, 3:34 AM

## 2020-11-03 NOTE — Progress Notes (Signed)
Patient admitted to the hospital earlier this morning by Dr. Toniann Fail  Patient seen and examined.  She feels generally weak.  Overall nausea and vomiting has been improving and she was able to tolerate some yogurt yesterday.  She worked with physical therapy this morning and did have some difficulty ambulating.  Describes her difficulty is related to coordination/balance rather than true leg weakness.  She does not have any numbness or paresthesias in her lower extremities.  General exam: Alert, awake, oriented x 3 Respiratory system: Clear to auscultation. Respiratory effort normal. Cardiovascular system:RRR. No murmurs, rubs, gallops. Gastrointestinal system: Abdomen is nondistended, soft and nontender. No organomegaly or masses felt. Normal bowel sounds heard. Central nervous system: Alert and oriented.  Does appear to have a mild tremor in her right arm, question asterixis, strength is 5 out of 5 in lower extremities bilaterally, does not appear to have any cerebellar signs with coordination in her upper or lower extremities while laying in bed.  She is noted to have some horizontal nystagmus bilaterally.  Tone appears to be normal in upper and lower extremities. Extremities: No C/C/E, +pedal pulses Skin: No rashes, lesions or ulcers Psychiatry: Judgement and insight appear normal. Mood & affect appropriate.   Assessment/plan  Generalized weakness with gait ataxia. -Etiology is not entirely clear. -Appears to have fair strength in lower extremities bilaterally -Patient reports difficulty in ambulation due to coordination/balance -B12 checked and noted to be on the lower end at 261.  She has been started on replacement -Ammonia mildly elevated at 49 with questionable tremor/asterixis and right upper extremity.  We will start on lactulose -Thiamine level in process.  Although she does not have a clear encephalopathy, she does appear to have some oculomotor dysfunction with nystagmus and has  some ataxia with broad-based gait. -She has been started on high-dose thiamine supplementation -We will check MRI brain -Continue PT/OT -Orthostatics are negative  Seizure disorder -Continue on home dose of Lamictal -She reports being started on a short course of Tegretol at alcohol rehab, she is not chronically on this medication.  Nausea and vomiting -Reports having nausea and vomiting over the past several days -Overall symptoms appear to have improved -Continue on PPI, antiemetics  Hypokalemia/hypophosphatemia -Likely related to nutritional deficiencies -Replace  Elevated transaminases -Likely related to alcohol use -Continue to follow  Alcohol abuse -Reports that she was recently consuming 4 bottles of vanilla extract daily -Reports that her last drink was approximately 6 days ago -She is likely out of the window for delirium tremens, but we will continue to monitor her on CIWA for now  [employer]

## 2020-11-04 ENCOUNTER — Observation Stay (HOSPITAL_COMMUNITY): Payer: BC Managed Care – PPO

## 2020-11-04 DIAGNOSIS — G40109 Localization-related (focal) (partial) symptomatic epilepsy and epileptic syndromes with simple partial seizures, not intractable, without status epilepticus: Secondary | ICD-10-CM | POA: Diagnosis present

## 2020-11-04 DIAGNOSIS — F17290 Nicotine dependence, other tobacco product, uncomplicated: Secondary | ICD-10-CM | POA: Diagnosis present

## 2020-11-04 DIAGNOSIS — E512 Wernicke's encephalopathy: Secondary | ICD-10-CM | POA: Diagnosis present

## 2020-11-04 DIAGNOSIS — Z91041 Radiographic dye allergy status: Secondary | ICD-10-CM | POA: Diagnosis not present

## 2020-11-04 DIAGNOSIS — Z885 Allergy status to narcotic agent status: Secondary | ICD-10-CM | POA: Diagnosis not present

## 2020-11-04 DIAGNOSIS — R270 Ataxia, unspecified: Secondary | ICD-10-CM

## 2020-11-04 DIAGNOSIS — R29898 Other symptoms and signs involving the musculoskeletal system: Secondary | ICD-10-CM | POA: Diagnosis not present

## 2020-11-04 DIAGNOSIS — D7589 Other specified diseases of blood and blood-forming organs: Secondary | ICD-10-CM | POA: Diagnosis present

## 2020-11-04 DIAGNOSIS — F1020 Alcohol dependence, uncomplicated: Secondary | ICD-10-CM | POA: Diagnosis present

## 2020-11-04 DIAGNOSIS — R112 Nausea with vomiting, unspecified: Secondary | ICD-10-CM | POA: Diagnosis present

## 2020-11-04 DIAGNOSIS — E876 Hypokalemia: Secondary | ICD-10-CM | POA: Diagnosis present

## 2020-11-04 DIAGNOSIS — R531 Weakness: Secondary | ICD-10-CM | POA: Diagnosis present

## 2020-11-04 DIAGNOSIS — Z79899 Other long term (current) drug therapy: Secondary | ICD-10-CM | POA: Diagnosis not present

## 2020-11-04 DIAGNOSIS — R7401 Elevation of levels of liver transaminase levels: Secondary | ICD-10-CM | POA: Diagnosis present

## 2020-11-04 DIAGNOSIS — F32A Depression, unspecified: Secondary | ICD-10-CM | POA: Diagnosis present

## 2020-11-04 DIAGNOSIS — R260 Ataxic gait: Secondary | ICD-10-CM | POA: Diagnosis present

## 2020-11-04 DIAGNOSIS — G40209 Localization-related (focal) (partial) symptomatic epilepsy and epileptic syndromes with complex partial seizures, not intractable, without status epilepticus: Secondary | ICD-10-CM | POA: Diagnosis not present

## 2020-11-04 DIAGNOSIS — H5500 Unspecified nystagmus: Secondary | ICD-10-CM | POA: Diagnosis present

## 2020-11-04 DIAGNOSIS — Z881 Allergy status to other antibiotic agents status: Secondary | ICD-10-CM | POA: Diagnosis not present

## 2020-11-04 DIAGNOSIS — E538 Deficiency of other specified B group vitamins: Secondary | ICD-10-CM | POA: Diagnosis present

## 2020-11-04 DIAGNOSIS — Z20822 Contact with and (suspected) exposure to covid-19: Secondary | ICD-10-CM | POA: Diagnosis present

## 2020-11-04 LAB — COMPREHENSIVE METABOLIC PANEL
ALT: 60 U/L — ABNORMAL HIGH (ref 0–44)
AST: 44 U/L — ABNORMAL HIGH (ref 15–41)
Albumin: 3.2 g/dL — ABNORMAL LOW (ref 3.5–5.0)
Alkaline Phosphatase: 42 U/L (ref 38–126)
Anion gap: 6 (ref 5–15)
BUN: <5 mg/dL — ABNORMAL LOW (ref 6–20)
CO2: 20 mmol/L — ABNORMAL LOW (ref 22–32)
Calcium: 8.4 mg/dL — ABNORMAL LOW (ref 8.9–10.3)
Chloride: 110 mmol/L (ref 98–111)
Creatinine, Ser: 0.51 mg/dL (ref 0.44–1.00)
GFR, Estimated: >60 mL/min (ref >60)
Glucose, Bld: 85 mg/dL (ref 70–99)
Potassium: 5.1 mmol/L (ref 3.5–5.1)
Sodium: 136 mmol/L (ref 135–145)
Total Bilirubin: 0.7 mg/dL (ref 0.3–1.2)
Total Protein: 5.9 g/dL — ABNORMAL LOW (ref 6.5–8.1)

## 2020-11-04 LAB — CBC
HCT: 42.8 % (ref 36.0–46.0)
Hemoglobin: 14 g/dL (ref 12.0–15.0)
MCH: 34.5 pg — ABNORMAL HIGH (ref 26.0–34.0)
MCHC: 32.7 g/dL (ref 30.0–36.0)
MCV: 105.4 fL — ABNORMAL HIGH (ref 80.0–100.0)
Platelets: 172 10*3/uL (ref 150–400)
RBC: 4.06 MIL/uL (ref 3.87–5.11)
RDW: 14.6 % (ref 11.5–15.5)
WBC: 5 10*3/uL (ref 4.0–10.5)
nRBC: 0 % (ref 0.0–0.2)

## 2020-11-04 LAB — MAGNESIUM: Magnesium: 2.4 mg/dL (ref 1.7–2.4)

## 2020-11-04 LAB — PHOSPHORUS: Phosphorus: 2.3 mg/dL — ABNORMAL LOW (ref 2.5–4.6)

## 2020-11-04 LAB — TSH: TSH: 3.537 u[IU]/mL (ref 0.350–4.500)

## 2020-11-04 MED ORDER — LORAZEPAM 0.5 MG PO TABS
0.5000 mg | ORAL_TABLET | Freq: Four times a day (QID) | ORAL | Status: DC | PRN
  Administered 2020-11-04 – 2020-11-06 (×3): 0.5 mg via ORAL
  Filled 2020-11-04 (×3): qty 1

## 2020-11-04 MED ORDER — GADOBUTROL 1 MMOL/ML IV SOLN
7.0000 mL | Freq: Once | INTRAVENOUS | Status: AC | PRN
  Administered 2020-11-04: 7 mL via INTRAVENOUS

## 2020-11-04 MED ORDER — ESCITALOPRAM OXALATE 10 MG PO TABS
10.0000 mg | ORAL_TABLET | Freq: Every day | ORAL | Status: DC
  Administered 2020-11-05 – 2020-11-06 (×2): 10 mg via ORAL
  Filled 2020-11-04 (×2): qty 1

## 2020-11-04 MED ORDER — CYANOCOBALAMIN 1000 MCG/ML IJ SOLN
1000.0000 ug | Freq: Once | INTRAMUSCULAR | Status: AC
  Administered 2020-11-04: 1000 ug via INTRAMUSCULAR
  Filled 2020-11-04: qty 1

## 2020-11-04 MED ORDER — SODIUM CHLORIDE 0.9 % IV SOLN: INTRAVENOUS | Status: DC

## 2020-11-04 MED ORDER — LACTULOSE 10 GM/15ML PO SOLN
20.0000 g | Freq: Every day | ORAL | Status: DC
  Administered 2020-11-05 – 2020-11-06 (×2): 20 g via ORAL
  Filled 2020-11-04 (×2): qty 30

## 2020-11-04 NOTE — Progress Notes (Signed)
Received patient back from MRI.

## 2020-11-04 NOTE — Progress Notes (Signed)
PROGRESS NOTE    Kelly Briggs  EXN:[id number] DOB: 01/25/1980 DOA: 11/02/2020 PCP: Patient, No Pcp Per (Inactive)    Brief Narrative:  40 year old female with a history of alcohol abuse and seizures, was admitted to [facility] for alcohol rehab on October 10.  She reports having increased difficulty with ambulation due to issues with her balance/gait.  She was sent to the ER for evaluation.  Neuroimaging was unrevealing.  Upon further work-up, it is felt that her symptoms may be related to Wernicke's encephalopathy since she appears to be improving with high-dose IV thiamine.   Assessment & Plan:   Principal Problem:   Lower extremity weakness Active Problems:   Partial epilepsy with impairment of consciousness (HCC)   Weakness   Generalized weakness with gait ataxia. -MRI brain, T-spine and C-spine were all found to be unremarkable -Orthostatics noted to be negative -Considering her clinical picture of wide-based gait/ataxia, oculomotor dysfunction with nystagmus, history of alcoholism and poor p.o. 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 Derry Lory who felt this was a reasonable etiology -Would continue with high-dose IV thiamine to complete course -Continue folic acid, multivitamin -Continue PT/OT  Vitamin B12 deficiency -B12 noted to be low normal at 261 -This may also be contributing to her gait issues -She has been started on replacement   Seizure disorder -Continued on home dose of Lamictal -She reports being started on a short course of Tegretol at alcohol rehab, she is not chronically on this medication.   Nausea and vomiting -Reports having nausea and vomiting over the past several days -Overall symptoms appear to have improved -Continue on PPI, antiemetics   Hypokalemia/hypophosphatemia -Likely related to nutritional  deficiencies -Replace   Elevated transaminases -Likely related to alcohol use -Continue to follow   Alcohol abuse -Reports that she was recently consuming 4 bottles of vanilla extract daily -Reports that her last drink was approximately 6 days ago -She is likely out of the window for delirium tremens, but we will continue to monitor her on CIWA for now   DVT prophylaxis: SCDs Start: 11/03/20 0333  Code Status: Full code Family Communication: Discussed with patient Disposition Plan: Status is: Inpatient  The patient will require care spanning > 2 midnights and should be moved to inpatient because: Patient needs continued hospital stay for intravenous high-dose thiamine    Consultants:    Procedures:    Antimicrobials:      Subjective: She is feeling better today.  When standing up, feels as though her balance is better.  P.o. intake is improving.  She is not having any nausea and vomiting.  She did have a bowel movement after lactulose.  Objective: Vitals:   11/03/20 1947 11/04/20 0530 11/04/20 1159 11/04/20 1904  BP: 109/79 107/79 111/89 112/82  Pulse: 75 74 82 80  Resp: 16 16 16 16   Temp: (!) 97.5 F (36.4 C) 97.7 F (36.5 C) 98.6 F (37 C) 98.1 F (36.7 C)  TempSrc: Oral Oral Oral Oral  SpO2: 97% 99% 97% 98%  Weight:      Height:        Intake/Output Summary (Last 24 hours) at 11/04/2020 1918 Last data filed at 11/04/2020 1823 Gross per 24 hour  Intake 1616.67 ml  Output --  Net 1616.67 ml   Filed Weights   11/02/20 1802  Weight: 69.4 kg    Examination:  General exam: Appears calm and  comfortable  Respiratory system: Clear to auscultation. Respiratory effort normal. Cardiovascular system: S1 & S2 heard, RRR. No JVD, murmurs, rubs, gallops or clicks. No pedal edema. Gastrointestinal system: Abdomen is nondistended, soft and nontender. No organomegaly or masses felt. Normal bowel sounds heard. Central nervous system: Alert and oriented. No focal  neurological deficits. Extremities: Symmetric 5 x 5 power. Skin: No rashes, lesions or ulcers Psychiatry: Judgement and insight appear normal. Mood & affect appropriate.     Data Reviewed: I have personally reviewed following labs and imaging studies  CBC: Recent Labs  Lab 11/02/20 1902 11/02/20 1909 11/03/20 0505 11/04/20 0415  WBC  --  4.7 4.2 5.0  NEUTROABS  --  2.6  --   --   HGB 16.0* 14.7 13.3 14.0  HCT 47.0* 43.9 40.3 42.8  MCV  --  102.8* 104.7* 105.4*  PLT  --  197 171 172   Basic Metabolic Panel: Recent Labs  Lab 11/02/20 1902 11/02/20 1909 11/02/20 2100 11/03/20 0505 11/04/20 0415  NA 137 135  --  138 136  K 4.3 3.9  --  3.1* 5.1  CL 101 101  --  106 110  CO2  --  24  --  28 20*  GLUCOSE 91 93  --  119* 85  BUN 6 8  --  6 <5*  CREATININE 0.60 0.60  --  0.67 0.51  CALCIUM  --  9.4  --  8.5* 8.4*  MG  --   --  1.9 1.7 2.4  PHOS  --   --   --  2.2* 2.3*   GFR: Estimated Creatinine Clearance: 89.7 mL/min (by C-G formula based on SCr of 0.51 mg/dL). Liver Function Tests: Recent Labs  Lab 11/02/20 1909 11/03/20 0505 11/04/20 0415  AST 59* 42* 44*  ALT 70* 56* 60*  ALKPHOS 55 41 42  BILITOT 1.0 0.8 0.7  PROT 7.2 5.4* 5.9*  ALBUMIN 3.9 3.0* 3.2*   No results for input(s): LIPASE, AMYLASE in the last 168 hours. Recent Labs  Lab 11/02/20 2130  AMMONIA 49*   Coagulation Profile: No results for input(s): INR, PROTIME in the last 168 hours. Cardiac Enzymes: No results for input(s): CKTOTAL, CKMB, CKMBINDEX, TROPONINI in the last 168 hours. BNP (last 3 results) No results for input(s): PROBNP in the last 8760 hours. HbA1C: No results for input(s): HGBA1C in the last 72 hours. CBG: No results for input(s): GLUCAP in the last 168 hours. Lipid Profile: No results for input(s): CHOL, HDL, LDLCALC, TRIG, CHOLHDL, LDLDIRECT in the last 72 hours. Thyroid Function Tests: Recent Labs    11/04/20 0415  TSH 3.537   Anemia Panel: Recent Labs     11/02/20 2100  VITAMINB12 261   Sepsis Labs: Recent Labs  Lab 11/03/20 0505  LATICACIDVEN 1.6    Recent Results (from the past 240 hour(s))  Resp Panel by RT-PCR (Flu A&B, Covid) Nasopharyngeal Swab     Status: None   Collection Time: 11/02/20  8:47 PM   Specimen: Nasopharyngeal Swab; Nasopharyngeal(NP) swabs in vial transport medium  Result Value Ref Range Status   SARS Coronavirus 2 by RT PCR NEGATIVE NEGATIVE Final    Comment: (NOTE) SARS-CoV-2 target nucleic acids are NOT DETECTED.  The SARS-CoV-2 RNA is generally detectable in upper respiratory specimens during the acute phase of infection. The lowest concentration of SARS-CoV-2 viral copies this assay can detect is 138 copies/mL. A negative result does not preclude SARS-Cov-2 infection and should not be used as the sole  basis for treatment or other patient management decisions. A negative result may occur with  improper specimen collection/handling, submission of specimen other than nasopharyngeal swab, presence of viral mutation(s) within the areas targeted by this assay, and inadequate number of viral copies(<138 copies/mL). A negative result must be combined with clinical observations, patient history, and epidemiological information. The expected result is Negative.  Fact Sheet for Patients:  BloggerCourse.com  Fact Sheet for Healthcare Providers:  SeriousBroker.it  This test is no t yet approved or cleared by the [place] FDA and  has been authorized for detection and/or diagnosis of SARS-CoV-2 by FDA under an Emergency Use Authorization (EUA). This EUA will remain  in effect (meaning this test can be used) for the duration of the COVID-19 declaration under Section 564(b)(1) of the Act, 21 U.S.C.section 360bbb-3(b)(1), unless the authorization is terminated  or revoked sooner.       Influenza A by PCR NEGATIVE NEGATIVE Final   Influenza B by PCR  NEGATIVE NEGATIVE Final    Comment: (NOTE) The Xpert Xpress SARS-CoV-2/FLU/RSV plus assay is intended as an aid in the diagnosis of influenza from Nasopharyngeal swab specimens and should not be used as a sole basis for treatment. Nasal washings and aspirates are unacceptable for Xpert Xpress SARS-CoV-2/FLU/RSV testing.  Fact Sheet for Patients: BloggerCourse.com  Fact Sheet for Healthcare Providers: SeriousBroker.it  This test is not yet approved or cleared by the [place] FDA and has been authorized for detection and/or diagnosis of SARS-CoV-2 by FDA under an Emergency Use Authorization (EUA). This EUA will remain in effect (meaning this test can be used) for the duration of the COVID-19 declaration under Section 564(b)(1) of the Act, 21 U.S.C. section 360bbb-3(b)(1), unless the authorization is terminated or revoked.  Performed at [facility], [address] [place], [place] [address]          Radiology Studies: MR BRAIN W WO CONTRAST  Result Date: 11/04/2020 CLINICAL DATA:  Dizziness, ataxia EXAM: MRI HEAD WITHOUT AND WITH CONTRAST TECHNIQUE: Multiplanar, multiecho pulse sequences of the brain and surrounding structures were obtained without and with intravenous contrast. CONTRAST:  76mL GADAVIST GADOBUTROL 1 MMOL/ML IV SOLN COMPARISON:  None. FINDINGS: Brain: There is no acute intracranial hemorrhage, extra-axial fluid collection, or acute infarct. There is no parenchymal signal abnormality. The ventricles are normal in size. There is no mass abnormal enhancement.  There is no midline shift. Vascular: Normal flow voids. Skull and upper cervical spine: Normal marrow signal. Sinuses/Orbits: The paranasal sinuses are clear. The globes and orbits are unremarkable. Other: None. IMPRESSION: Normal brain MRI. Electronically Signed   By: Lesia Hausen M.D.   On: 11/04/2020 09:34   MR Cervical Spine W or Wo  Contrast  Result Date: 11/02/2020 CLINICAL DATA:  Bilateral lower extremity weakness with difficulty walking EXAM: MRI CERVICAL SPINE WITHOUT AND WITH CONTRAST TECHNIQUE: Multiplanar and multiecho pulse sequences of the cervical spine, to include the craniocervical junction and cervicothoracic junction, were obtained without and with intravenous contrast. CONTRAST:  7.63mL GADAVIST GADOBUTROL 1 MMOL/ML IV SOLN COMPARISON:  None. FINDINGS: Alignment: Physiologic. Vertebrae: No fracture, evidence of discitis, or bone lesion. Cord: Normal signal and morphology. Posterior Fossa, vertebral arteries, paraspinal tissues: Negative. Disc levels: There is no spinal canal or neural foraminal stenosis. IMPRESSION: Normal MRI of the cervical spine. Electronically Signed   By: Deatra Robinson M.D.   On: 11/02/2020 23:33   MR THORACIC SPINE W WO CONTRAST  Result Date: 11/02/2020 CLINICAL DATA:  Bilateral lower  extremity weakness EXAM: MRI THORACIC WITHOUT AND WITH CONTRAST TECHNIQUE: Multiplanar and multiecho pulse sequences of the thoracic spine were obtained without and with intravenous contrast. CONTRAST:  7.32mL GADAVIST GADOBUTROL 1 MMOL/ML IV SOLN COMPARISON:  None. FINDINGS: Alignment:  Physiologic. Vertebrae: No fracture, evidence of discitis, or bone lesion. Cord:  Normal signal and morphology. Paraspinal and other soft tissues: Negative. Disc levels: No spinal canal or neural foraminal stenosis. No abnormal contrast enhancement. IMPRESSION: Normal thoracic spine MRI. Electronically Signed   By: Deatra Robinson M.D.   On: 11/02/2020 23:35        Scheduled Meds:  folic acid  1 mg Oral Daily   [START ON 11/05/2020] lactulose  20 g Oral Daily   lamoTRIgine  200 mg Oral BID   multivitamin with minerals  1 tablet Oral Daily   pantoprazole  40 mg Oral Daily   [START ON 11/06/2020] thiamine  100 mg Oral Daily   Continuous Infusions:  sodium chloride 100 mL/hr at 11/04/20 0955   thiamine injection 500 mg  (11/04/20 1411)     LOS: 0 days    Time spent:    Erick Blinks, MD [facility]   If 7PM-7AM, please contact night-coverage [email or web address]  11/04/2020, 7:18 PM

## 2020-11-04 NOTE — Progress Notes (Signed)
End of shift:   Patient resting in bed and resting comfortably. Vital signs stable. Denies any withdrawal symptoms other than visible hand tremor. Two doses of Ativan given over this shift. Mobility and ambulation was good over the shift. No concerns at this time.

## 2020-11-05 LAB — BASIC METABOLIC PANEL
Anion gap: 4 — ABNORMAL LOW (ref 5–15)
BUN: <5 mg/dL — ABNORMAL LOW (ref 6–20)
CO2: 23 mmol/L (ref 22–32)
Calcium: 8.6 mg/dL — ABNORMAL LOW (ref 8.9–10.3)
Chloride: 109 mmol/L (ref 98–111)
Creatinine, Ser: 0.54 mg/dL (ref 0.44–1.00)
GFR, Estimated: >60 mL/min (ref >60)
Glucose, Bld: 99 mg/dL (ref 70–99)
Potassium: 4.3 mmol/L (ref 3.5–5.1)
Sodium: 136 mmol/L (ref 135–145)

## 2020-11-05 LAB — MAGNESIUM: Magnesium: 2.3 mg/dL (ref 1.7–2.4)

## 2020-11-05 LAB — CARBAMAZEPINE, FREE AND TOTAL
Carbamazepine, Free: 2 ug/mL (ref 0.6–4.2)
Carbamazepine, Total: 6.8 ug/mL (ref 4.0–12.0)

## 2020-11-05 LAB — PHOSPHORUS: Phosphorus: 3.4 mg/dL (ref 2.5–4.6)

## 2020-11-05 LAB — LAMOTRIGINE LEVEL: Lamotrigine Lvl: 14.1 ug/mL (ref 2.0–20.0)

## 2020-11-05 MED ORDER — VITAMIN B-12 1000 MCG PO TABS
1000.0000 ug | ORAL_TABLET | Freq: Every day | ORAL | Status: DC
  Administered 2020-11-05 – 2020-11-06 (×2): 1000 ug via ORAL
  Filled 2020-11-05 (×2): qty 1

## 2020-11-05 NOTE — Progress Notes (Signed)
Physical Therapy Treatment/Discharge from PT Patient Details Name: Kelly Briggs MRN: [id number] DOB: Mar 27, 1980 Today's Date: 11/05/2020   History of Present Illness Kelly Briggs is a 40 y.o. female with history of alcohol abuse and seizures was admitted to the alcohol rehab on October 29, 2020 about 5 days ago.  Patient last drink was then.  The following day patient started out of increasing weakness particularly lower extremity with difficulty ambulating.  Patient as per the report was also started on medications at the rehab including carbamazepine.  Due to persistent weakness and increasing tremors patient was transferred to [facility].    PT Comments    Improved mobility. Independent with all mobility tasks. Pt has been mobilizing in room independently as well. No further PT needs. Will sign off.     Recommendations for follow up therapy are one component of a multi-disciplinary discharge planning process, led by the attending physician.  Recommendations may be updated based on patient status, additional functional criteria and insurance authorization.  Follow Up Recommendations  No PT follow up     Equipment Recommendations  None recommended by PT    Recommendations for Other Services       Precautions / Restrictions Precautions Precautions: None Restrictions Weight Bearing Restrictions: No     Mobility  Bed Mobility Overal bed mobility: Independent                  Transfers Overall transfer level: Independent                  Ambulation/Gait Ambulation/Gait assistance: Independent Gait Distance (Feet): 150 Feet Assistive device: None Gait Pattern/deviations: WFL(Within Functional Limits)         Stairs             Wheelchair Mobility    Modified Rankin (Stroke Patients Only)       Balance Overall balance assessment: Independent                                          Cognition Arousal/Alertness:  Awake/alert Behavior During Therapy: WFL for tasks assessed/performed Overall Cognitive Status: Within Functional Limits for tasks assessed                                        Exercises      General Comments        Pertinent Vitals/Pain Pain Assessment: No/denies pain    Home Living                      Prior Function            PT Goals (current goals can now be found in the care plan section) Progress towards PT goals: Goals met/education completed, patient discharged from PT    Frequency           PT Plan Discharge plan needs to be updated    Co-evaluation              AM-PAC PT "6 Clicks" Mobility   Outcome Measure  Help needed turning from your back to your side while in a flat bed without using bedrails?: None Help needed moving from lying on your back to sitting on the side of a flat bed without using bedrails?: None Help  needed moving to and from a bed to a chair (including a wheelchair)?: None Help needed standing up from a chair using your arms (e.g., wheelchair or bedside chair)?: None Help needed to walk in hospital room?: None Help needed climbing 3-5 steps with a railing? : None 6 Click Score: 24    End of Session   Activity Tolerance: Patient tolerated treatment well Patient left: in bed;with call bell/phone within reach         Time: 1420-1428 PT Time Calculation (min) (ACUTE ONLY): 8 min  Charges:  $Gait Training: 8-22 mins                         Doreatha Massed, PT Acute Rehabilitation  Office: [phone] Pager: [phone]

## 2020-11-05 NOTE — Progress Notes (Signed)
PROGRESS NOTE    Ben Sanz  XNA:[id number] DOB: 1980-06-15 DOA: 11/02/2020 PCP: Patient, No Pcp Per (Inactive)    Brief Narrative:  40 year old female with a history of alcohol abuse and seizures, was admitted to [facility] for alcohol rehab on October 10.  She reports having increased difficulty with ambulation due to issues with her balance/gait.  She was sent to the ER for evaluation.  Neuroimaging was unrevealing.  Upon further work-up, it is felt that her symptoms may be related to Wernicke's encephalopathy since she appears to be improving with high-dose IV thiamine.   Assessment & Plan:   Principal Problem:   Lower extremity weakness Active Problems:   Partial epilepsy with impairment of consciousness (HCC)   Weakness   Ataxia   Wernicke encephalopathy   Generalized weakness with gait ataxia. -MRI brain, T-spine and C-spine were all found to be unremarkable -Orthostatics noted to be negative -Considering her clinical picture of wide-based gait/ataxia, oculomotor dysfunction with nystagmus, history of alcoholism and poor p.o. 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 Derry Lory who felt this was a reasonable etiology -Would continue with high-dose IV thiamine to complete course -Continue folic acid, multivitamin -Continue PT/OT -she is now able to walk independently  Vitamin B12 deficiency -B12 noted to be low normal at 261 -This may also be contributing to her gait issues -She has been started on replacement   Seizure disorder -Continued on home dose of Lamictal -She reports being started on a short course of Tegretol at alcohol rehab, she is not chronically on this medication.   Nausea and vomiting -Reports having nausea and vomiting over the past several days -Overall symptoms appear to have improved -Continue on PPI,  antiemetics   Hypokalemia/hypophosphatemia -Likely related to nutritional deficiencies -Replace   Elevated transaminases -Likely related to alcohol use -Continue to follow   Alcohol abuse -Reports that she was recently consuming 4 bottles of vanilla extract daily -Reports that her last drink was approximately 6 days ago -She is likely out of the window for delirium tremens, but we will continue to monitor her on CIWA for now   DVT prophylaxis: SCDs Start: 11/03/20 0333  Code Status: Full code Family Communication: Discussed with patient Disposition Plan: Status is: Inpatient  The patient will require care spanning > 2 midnights and should be moved to inpatient because: Patient needs continued hospital stay for intravenous high-dose thiamine    Consultants:    Procedures:    Antimicrobials:      Subjective: Feels better. Feels that gait has normalized. No Nausea or vomiting  Objective: Vitals:   11/04/20 1904 11/05/20 0545 11/05/20 1318 11/05/20 1825  BP: 112/82 (!) 125/97 (!) 130/94 (!) 115/92  Pulse: 80 68 88 80  Resp: 16 13 18 20   Temp: 98.1 F (36.7 C) 98.8 F (37.1 C) 98.6 F (37 C) 97.9 F (36.6 C)  TempSrc: Oral     SpO2: 98% 100% 97% 98%  Weight:      Height:        Intake/Output Summary (Last 24 hours) at 11/05/2020 1925 Last data filed at 11/05/2020 1658 Gross per 24 hour  Intake 1435.01 ml  Output --  Net 1435.01 ml   Filed Weights   11/02/20 1802  Weight: 69.4 kg    Examination:  General exam: Appears calm and comfortable  Respiratory system: Clear to auscultation. Respiratory effort normal. Cardiovascular system:  S1 & S2 heard, RRR. No JVD, murmurs, rubs, gallops or clicks. No pedal edema. Gastrointestinal system: Abdomen is nondistended, soft and nontender. No organomegaly or masses felt. Normal bowel sounds heard. Central nervous system: Alert and oriented. No focal neurological deficits. Extremities: Symmetric 5 x 5  power. Skin: No rashes, lesions or ulcers Psychiatry: Judgement and insight appear normal. Mood & affect appropriate.     Data Reviewed: I have personally reviewed following labs and imaging studies  CBC: Recent Labs  Lab 11/02/20 1902 11/02/20 1909 11/03/20 0505 11/04/20 0415  WBC  --  4.7 4.2 5.0  NEUTROABS  --  2.6  --   --   HGB 16.0* 14.7 13.3 14.0  HCT 47.0* 43.9 40.3 42.8  MCV  --  102.8* 104.7* 105.4*  PLT  --  197 171 172   Basic Metabolic Panel: Recent Labs  Lab 11/02/20 1902 11/02/20 1909 11/02/20 2100 11/03/20 0505 11/04/20 0415 11/05/20 0458  NA 137 135  --  138 136 136  K 4.3 3.9  --  3.1* 5.1 4.3  CL 101 101  --  106 110 109  CO2  --  24  --  28 20* 23  GLUCOSE 91 93  --  119* 85 99  BUN 6 8  --  6 <5* <5*  CREATININE 0.60 0.60  --  0.67 0.51 0.54  CALCIUM  --  9.4  --  8.5* 8.4* 8.6*  MG  --   --  1.9 1.7 2.4 2.3  PHOS  --   --   --  2.2* 2.3* 3.4   GFR: Estimated Creatinine Clearance: 89.7 mL/min (by C-G formula based on SCr of 0.54 mg/dL). Liver Function Tests: Recent Labs  Lab 11/02/20 1909 11/03/20 0505 11/04/20 0415  AST 59* 42* 44*  ALT 70* 56* 60*  ALKPHOS 55 41 42  BILITOT 1.0 0.8 0.7  PROT 7.2 5.4* 5.9*  ALBUMIN 3.9 3.0* 3.2*   No results for input(s): LIPASE, AMYLASE in the last 168 hours. Recent Labs  Lab 11/02/20 2130  AMMONIA 49*   Coagulation Profile: No results for input(s): INR, PROTIME in the last 168 hours. Cardiac Enzymes: No results for input(s): CKTOTAL, CKMB, CKMBINDEX, TROPONINI in the last 168 hours. BNP (last 3 results) No results for input(s): PROBNP in the last 8760 hours. HbA1C: No results for input(s): HGBA1C in the last 72 hours. CBG: No results for input(s): GLUCAP in the last 168 hours. Lipid Profile: No results for input(s): CHOL, HDL, LDLCALC, TRIG, CHOLHDL, LDLDIRECT in the last 72 hours. Thyroid Function Tests: Recent Labs    11/04/20 0415  TSH 3.537   Anemia Panel: Recent Labs     11/02/20 2100  VITAMINB12 261   Sepsis Labs: Recent Labs  Lab 11/03/20 0505  LATICACIDVEN 1.6    Recent Results (from the past 240 hour(s))  Resp Panel by RT-PCR (Flu A&B, Covid) Nasopharyngeal Swab     Status: None   Collection Time: 11/02/20  8:47 PM   Specimen: Nasopharyngeal Swab; Nasopharyngeal(NP) swabs in vial transport medium  Result Value Ref Range Status   SARS Coronavirus 2 by RT PCR NEGATIVE NEGATIVE Final    Comment: (NOTE) SARS-CoV-2 target nucleic acids are NOT DETECTED.  The SARS-CoV-2 RNA is generally detectable in upper respiratory specimens during the acute phase of infection. The lowest concentration of SARS-CoV-2 viral copies this assay can detect is 138 copies/mL. A negative result does not preclude SARS-Cov-2 infection and should not be used as the sole  basis for treatment or other patient management decisions. A negative result may occur with  improper specimen collection/handling, submission of specimen other than nasopharyngeal swab, presence of viral mutation(s) within the areas targeted by this assay, and inadequate number of viral copies(<138 copies/mL). A negative result must be combined with clinical observations, patient history, and epidemiological information. The expected result is Negative.  Fact Sheet for Patients:  BloggerCourse.com  Fact Sheet for Healthcare Providers:  SeriousBroker.it  This test is no t yet approved or cleared by the [place] FDA and  has been authorized for detection and/or diagnosis of SARS-CoV-2 by FDA under an Emergency Use Authorization (EUA). This EUA will remain  in effect (meaning this test can be used) for the duration of the COVID-19 declaration under Section 564(b)(1) of the Act, 21 U.S.C.section 360bbb-3(b)(1), unless the authorization is terminated  or revoked sooner.       Influenza A by PCR NEGATIVE NEGATIVE Final   Influenza B by PCR  NEGATIVE NEGATIVE Final    Comment: (NOTE) The Xpert Xpress SARS-CoV-2/FLU/RSV plus assay is intended as an aid in the diagnosis of influenza from Nasopharyngeal swab specimens and should not be used as a sole basis for treatment. Nasal washings and aspirates are unacceptable for Xpert Xpress SARS-CoV-2/FLU/RSV testing.  Fact Sheet for Patients: BloggerCourse.com  Fact Sheet for Healthcare Providers: SeriousBroker.it  This test is not yet approved or cleared by the [place] FDA and has been authorized for detection and/or diagnosis of SARS-CoV-2 by FDA under an Emergency Use Authorization (EUA). This EUA will remain in effect (meaning this test can be used) for the duration of the COVID-19 declaration under Section 564(b)(1) of the Act, 21 U.S.C. section 360bbb-3(b)(1), unless the authorization is terminated or revoked.  Performed at [facility], [address], [place], [place] [address]          Radiology Studies: MR BRAIN W WO CONTRAST  Result Date: 11/04/2020 CLINICAL DATA:  Dizziness, ataxia EXAM: MRI HEAD WITHOUT AND WITH CONTRAST TECHNIQUE: Multiplanar, multiecho pulse sequences of the brain and surrounding structures were obtained without and with intravenous contrast. CONTRAST:  76mL GADAVIST GADOBUTROL 1 MMOL/ML IV SOLN COMPARISON:  None. FINDINGS: Brain: There is no acute intracranial hemorrhage, extra-axial fluid collection, or acute infarct. There is no parenchymal signal abnormality. The ventricles are normal in size. There is no mass abnormal enhancement.  There is no midline shift. Vascular: Normal flow voids. Skull and upper cervical spine: Normal marrow signal. Sinuses/Orbits: The paranasal sinuses are clear. The globes and orbits are unremarkable. Other: None. IMPRESSION: Normal brain MRI. Electronically Signed   By: Lesia Hausen M.D.   On: 11/04/2020 09:34        Scheduled Meds:   escitalopram  10 mg Oral Daily   folic acid  1 mg Oral Daily   lactulose  20 g Oral Daily   lamoTRIgine  200 mg Oral BID   multivitamin with minerals  1 tablet Oral Daily   pantoprazole  40 mg Oral Daily   [START ON 11/06/2020] thiamine  100 mg Oral Daily   vitamin B-12  1,000 mcg Oral Daily   Continuous Infusions:  thiamine injection 500 mg (11/05/20 1412)     LOS: 1 day    Time spent:    Erick Blinks, MD [facility]   If 7PM-7AM, please contact night-coverage [email or web address]  11/05/2020, 7:25 PM

## 2020-11-05 NOTE — TOC Initial Note (Signed)
Transition of Care [facility]) - Initial/Assessment Note    Patient Details  Name: Kelly Briggs MRN: [id number] Date of Birth: September 29, 1980  Transition of [facility]) CM/SW Contact:    Lanier Clam, RN Phone Number: 11/05/2020, 1:12 PM  Clinical Narrative:  Spoke to patient about d/c plans, also spoke to [facility] Admissions coordinator-Jessica. Patient plans to return back to [facility] @ d/c-per Shanda Bumps rep-patient must be independent of assistive devices,not needing any HHC services for acceptance-if not patient must d/c home. Patient aware of requirements for [facility]. Nsg can provide ambulatory transport to [facility] @ d/c.                 Expected Discharge Plan: IP Rehab Facility Barriers to Discharge: Continued Medical Work up   Patient Goals and CMS Choice Patient states their goals for this hospitalization and ongoing recovery are:: [facility] back to [email or web address].gov Compare Post Acute Care list provided to:: Patient Choice offered to / list presented to : Patient  Expected Discharge Plan and Services Expected Discharge Plan: IP Rehab Facility   Discharge Planning Services: CM Consult Post Acute Care Choice: IP Rehab Living arrangements for the past 2 months:  (Residential [facility] rehab)                                      Prior Living Arrangements/Services Living arrangements for the past 2 months:  (Residential [facility] rehab) Lives with:: Facility Resident Patient language and need for interpreter reviewed:: Yes Do you feel safe going back to the place where you live?: Yes      Need for Family Participation in Patient Care: No (Comment) Care giver support system in place?: Yes (comment)   Criminal Activity/Legal Involvement Pertinent to Current Situation/Hospitalization: No - Comment as needed  Activities of Daily Living Home Assistive Devices/Equipment: None ADL Screening (condition at time of admission) Patient's cognitive  ability adequate to safely complete daily activities?: No Is the patient deaf or have difficulty hearing?: No Does the patient have difficulty seeing, even when wearing glasses/contacts?: No Does the patient have difficulty concentrating, remembering, or making decisions?: No Patient able to express need for assistance with ADLs?: No Does the patient have difficulty dressing or bathing?: Yes Independently performs ADLs?: Yes (appropriate for developmental age) Does the patient have difficulty walking or climbing stairs?: Yes Weakness of Legs: Both Weakness of Arms/Hands: Both  Permission Sought/Granted Permission sought to share information with : Case Manager Permission granted to share information with : Yes, Verbal Permission Granted  Share Information with NAME: Case manager           Emotional Assessment Appearance:: Appears stated age Attitude/Demeanor/Rapport: Gracious Affect (typically observed): Accepting Orientation: : Oriented to Self, Oriented to Place, Oriented to  Time, Oriented to Situation Alcohol / Substance Use: Alcohol Use Psych Involvement: No (comment)  Admission diagnosis:  Weakness [R53.1] Weakness of both lower extremities [R29.898] Lower extremity weakness [R29.898] Ataxia [R27.0] Patient Active Problem List   Diagnosis Date Noted   Ataxia 11/04/2020   Wernicke encephalopathy 11/04/2020   Lower extremity weakness 11/03/2020   Weakness 11/02/2020   Nephrolithiasis 07/14/2012   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

## 2020-11-06 DIAGNOSIS — E512 Wernicke's encephalopathy: Secondary | ICD-10-CM

## 2020-11-06 MED ORDER — PANTOPRAZOLE SODIUM 40 MG PO TBEC: 40.0000 mg | DELAYED_RELEASE_TABLET | Freq: Every day | ORAL | 0 refills | Status: AC

## 2020-11-06 MED ORDER — FOLIC ACID 1 MG PO TABS: 1.0000 mg | ORAL_TABLET | Freq: Every day | ORAL | 1 refills | Status: AC

## 2020-11-06 MED ORDER — LACTULOSE 10 GM/15ML PO SOLN: 20.0000 g | Freq: Every day | ORAL | 0 refills | Status: AC | PRN

## 2020-11-06 MED ORDER — CYANOCOBALAMIN 1000 MCG PO TABS: 1000.0000 ug | ORAL_TABLET | Freq: Every day | ORAL | 1 refills | Status: AC

## 2020-11-06 NOTE — Plan of Care (Signed)
  Problem: Activity: Goal: Risk for activity intolerance will decrease Outcome: Progressing   Problem: Coping: Goal: Level of anxiety will decrease Outcome: Progressing   Problem: Education: Goal: Knowledge of General Education information will improve Description: Including pain rating scale, medication(s)/side effects and non-pharmacologic comfort measures Outcome: Progressing   Problem: Nutrition: Goal: Adequate nutrition will be maintained Outcome: Progressing   Problem: Activity: Goal: Risk for activity intolerance will decrease Outcome: Adequate for Discharge

## 2020-11-06 NOTE — Plan of Care (Signed)
  Problem: Clinical Measurements: Goal: Diagnostic test results will improve Outcome: Progressing   Problem: Coping: Goal: Level of anxiety will decrease Outcome: Progressing   

## 2020-11-06 NOTE — TOC Transition Note (Addendum)
Transition of Care [facility]) - CM/SW Discharge Note   Patient Details  Name: Kelly Briggs MRN: [id number] Date of Birth: 03-30-1980  [facility] CM/SW Contact:  Lanier Clam, RN Phone Number: 11/06/2020, 10:38 AM   Clinical Narrative: Spoke to patient about reminder to call [facility] to inform she is discharging today & will come there by [facility] ambulatory transport-Nsg to manage [facility] safe ride transport. No further CM needs.  12:49p-TC [facility]-per rep [facility]-they need Fax of :H&P,demographic sheet,labs,MD note,d/c instructions,d/c summary,PT note-faxed w/confirmation [phone]-they will review-then contact me for confirmation for acceptance-then they will have their own transport to pick patient up.Nsg updated.    Final next level of care: IP Rehab Facility Barriers to Discharge: No Barriers Identified   Patient Goals and CMS Choice Patient states their goals for this hospitalization and ongoing recovery are:: [facility] back to [facility].gov Compare Post Acute Care list provided to:: Patient Choice offered to / list presented to : Patient  Discharge Placement                       Discharge Plan and Services   Discharge Planning Services: CM Consult Post Acute Care Choice: IP Rehab                               Social Determinants of Health (SDOH) Interventions     Readmission Risk Interventions No flowsheet data found.

## 2020-11-06 NOTE — Discharge Summary (Signed)
Physician Discharge Summary  Kelly Briggs BPZ:[id number] DOB: 09-21-1980 DOA: 11/02/2020  PCP: Patient, No Pcp Per (Inactive)  Admit date: 11/02/2020 Discharge date: 11/06/2020  [facility]: [facility] Disposition:  [facility]  Recommendations for Outpatient Follow-up:  Follow up with PCP in 1-2 weeks Please obtain BMP/CBC in one week   Home Health: Equipment/Devices:  Discharge Condition:stable CODE STATUS:full code Diet recommendation: regular diet  Brief/Interim Summary: 40 year old female with a history of alcohol abuse and seizures, was admitted to [facility] for alcohol rehab on October 10.  She reports having increased difficulty with ambulation due to issues with her balance/gait.  She was sent to the ER for evaluation.  Neuroimaging was unrevealing.  Upon further work-up, it is felt that her symptoms may be related to Wernicke's encephalopathy since she appears to be improving with high-dose IV thiamine.  Discharge Diagnoses:  Principal Problem:   Lower extremity weakness Active Problems:   Partial epilepsy with impairment of consciousness (HCC)   Weakness   Ataxia   Wernicke encephalopathy  Generalized weakness with gait ataxia related to Wernicke encephalopathy -MRI brain, T-spine and C-spine were all found to be unremarkable -Orthostatics noted to be negative -Considering her clinical picture of wide-based gait/ataxia, oculomotor dysfunction with nystagmus, history of alcoholism and poor p.o. 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 Derry Lory who felt this was a reasonable etiology -she completed course of high dose IV thiamine and is now on oral replacement -Continue folic acid, multivitamin -she is now able to walk independently   Vitamin B12 deficiency -B12 noted to be low normal at 261 -This may also be  contributing to her gait issues -She has been started on replacement   Seizure disorder -Continued on home dose of Lamictal, levels checked and are therapeutic -She reports being started on a short course of Tegretol at alcohol rehab, she is not chronically on this medication.   Nausea and vomiting -Reports having nausea and vomiting over the past several days -Overall symptoms appear to have improved -Continue on PPI, antiemetics   Hypokalemia/hypophosphatemia -Likely related to nutritional deficiencies -Replace   Elevated transaminases -Likely related to alcohol use -Continue to follow   Alcohol abuse -Reports that she was recently consuming 4 bottles of vanilla extract daily -Reports that her last drink was approximately 10 days ago -[facility]  Discharge Instructions  Discharge Instructions     Diet - low sodium heart healthy   Complete by: As directed    Increase activity slowly   Complete by: As directed       Allergies as of 11/06/2020       Reactions   Iodinated Diagnostic Agents Hives   Ciprofloxacin Other (See Comments)   Oxybutynin    anticholinergic    Promethazine Other (See Comments)   Oxycodone Anxiety        Medication List     STOP taking these medications    TEGretol 200 MG tablet Generic drug: carbamazepine       TAKE these medications    cyanocobalamin 1000 MCG tablet Take 1 tablet (1,000 mcg total) by mouth daily. Start taking on: November 07, 2020   escitalopram 10 MG tablet Commonly known as: LEXAPRO Take 10 mg by mouth.   folic acid 1 MG tablet Commonly known as: FOLVITE Take 1 tablet (1 mg total) by mouth daily. Start  taking on: November 07, 2020   lactulose 10 GM/15ML solution Commonly known as: CHRONULAC Take 30 mLs (20 g total) by mouth daily as needed for mild constipation.   lamoTRIgine 200 MG tablet Commonly known as: LAMICTAL Take 200 mg by mouth 2  (two) times daily. What changed: Another medication with the same name was removed. Continue taking this medication, and follow the directions you see here.   levonorgestrel 20 MCG/24HR IUD Commonly known as: MIRENA 1 each by Intrauterine route once.   MULTI-VITAMIN DAILY PO Take 1 tablet by mouth daily. With folic acid   pantoprazole 40 MG tablet Commonly known as: PROTONIX Take 1 tablet (40 mg total) by mouth daily. Start taking on: November 07, 2020   thiamine 100 MG tablet Take 100 mg by mouth daily.   traZODone 50 MG tablet Commonly known as: DESYREL Take 50 mg by mouth at bedtime as needed for sleep.   VITAMIN D3 PO Take 1,000 Units by mouth daily.        Allergies  Allergen Reactions   Iodinated Diagnostic Agents Hives   Ciprofloxacin Other (See Comments)   Oxybutynin     anticholinergic    Promethazine Other (See Comments)   Oxycodone Anxiety    Consultations:    Procedures/Studies: MR BRAIN W WO CONTRAST  Result Date: 11/04/2020 CLINICAL DATA:  Dizziness, ataxia EXAM: MRI HEAD WITHOUT AND WITH CONTRAST TECHNIQUE: Multiplanar, multiecho pulse sequences of the brain and surrounding structures were obtained without and with intravenous contrast. CONTRAST:  45mL GADAVIST GADOBUTROL 1 MMOL/ML IV SOLN COMPARISON:  None. FINDINGS: Brain: There is no acute intracranial hemorrhage, extra-axial fluid collection, or acute infarct. There is no parenchymal signal abnormality. The ventricles are normal in size. There is no mass abnormal enhancement.  There is no midline shift. Vascular: Normal flow voids. Skull and upper cervical spine: Normal marrow signal. Sinuses/Orbits: The paranasal sinuses are clear. The globes and orbits are unremarkable. Other: None. IMPRESSION: Normal brain MRI. Electronically Signed   By: Lesia Hausen M.D.   On: 11/04/2020 09:34   MR Cervical Spine W or Wo Contrast  Result Date: 11/02/2020 CLINICAL DATA:  Bilateral lower extremity weakness with  difficulty walking EXAM: MRI CERVICAL SPINE WITHOUT AND WITH CONTRAST TECHNIQUE: Multiplanar and multiecho pulse sequences of the cervical spine, to include the craniocervical junction and cervicothoracic junction, were obtained without and with intravenous contrast. CONTRAST:  7.90mL GADAVIST GADOBUTROL 1 MMOL/ML IV SOLN COMPARISON:  None. FINDINGS: Alignment: Physiologic. Vertebrae: No fracture, evidence of discitis, or bone lesion. Cord: Normal signal and morphology. Posterior Fossa, vertebral arteries, paraspinal tissues: Negative. Disc levels: There is no spinal canal or neural foraminal stenosis. IMPRESSION: Normal MRI of the cervical spine. Electronically Signed   By: Deatra Robinson M.D.   On: 11/02/2020 23:33   MR THORACIC SPINE W WO CONTRAST  Result Date: 11/02/2020 CLINICAL DATA:  Bilateral lower extremity weakness EXAM: MRI THORACIC WITHOUT AND WITH CONTRAST TECHNIQUE: Multiplanar and multiecho pulse sequences of the thoracic spine were obtained without and with intravenous contrast. CONTRAST:  7.106mL GADAVIST GADOBUTROL 1 MMOL/ML IV SOLN COMPARISON:  None. FINDINGS: Alignment:  Physiologic. Vertebrae: No fracture, evidence of discitis, or bone lesion. Cord:  Normal signal and morphology. Paraspinal and other soft tissues: Negative. Disc levels: No spinal canal or neural foraminal stenosis. No abnormal contrast enhancement. IMPRESSION: Normal thoracic spine MRI. Electronically Signed   By: Deatra Robinson M.D.   On: 11/02/2020 23:35      Subjective: Feels great, no nausea or  vomiting, eager to be discharged  Discharge Exam: Vitals:   11/05/20 1825 11/05/20 2352 11/06/20 0615 11/06/20 0800  BP: (!) 115/92 113/88 119/90 119/90  Pulse: 80 73 67 67  Resp: 20 16 18    Temp: 97.9 F (36.6 C) 97.8 F (36.6 C) 98 F (36.7 C)   TempSrc:      SpO2: 98% 99% 99%   Weight:      Height:        General: Pt is alert, awake, not in acute distress Cardiovascular: RRR, S1/S2 +, no rubs, no  gallops Respiratory: CTA bilaterally, no wheezing, no rhonchi Abdominal: Soft, NT, ND, bowel sounds + Extremities: no edema, no cyanosis    The results of significant diagnostics from this hospitalization (including imaging, microbiology, ancillary and laboratory) are listed below for reference.     Microbiology: Recent Results (from the past 240 hour(s))  Resp Panel by RT-PCR (Flu A&B, Covid) Nasopharyngeal Swab     Status: None   Collection Time: 11/02/20  8:47 PM   Specimen: Nasopharyngeal Swab; Nasopharyngeal(NP) swabs in vial transport medium  Result Value Ref Range Status   SARS Coronavirus 2 by RT PCR NEGATIVE NEGATIVE Final    Comment: (NOTE) SARS-CoV-2 target nucleic acids are NOT DETECTED.  The SARS-CoV-2 RNA is generally detectable in upper respiratory specimens during the acute phase of infection. The lowest concentration of SARS-CoV-2 viral copies this assay can detect is 138 copies/mL. A negative result does not preclude SARS-Cov-2 infection and should not be used as the sole basis for treatment or other patient management decisions. A negative result may occur with  improper specimen collection/handling, submission of specimen other than nasopharyngeal swab, presence of viral mutation(s) within the areas targeted by this assay, and inadequate number of viral copies(<138 copies/mL). A negative result must be combined with clinical observations, patient history, and epidemiological information. The expected result is Negative.  Fact Sheet for Patients:  11/04/20  Fact Sheet for Healthcare Providers:  BloggerCourse.com  This test is no t yet approved or cleared by the [place] FDA and  has been authorized for detection and/or diagnosis of SARS-CoV-2 by FDA under an Emergency Use Authorization (EUA). This EUA will remain  in effect (meaning this test can be used) for the duration of the COVID-19  declaration under Section 564(b)(1) of the Act, 21 U.S.C.section 360bbb-3(b)(1), unless the authorization is terminated  or revoked sooner.       Influenza A by PCR NEGATIVE NEGATIVE Final   Influenza B by PCR NEGATIVE NEGATIVE Final    Comment: (NOTE) The Xpert Xpress SARS-CoV-2/FLU/RSV plus assay is intended as an aid in the diagnosis of influenza from Nasopharyngeal swab specimens and should not be used as a sole basis for treatment. Nasal washings and aspirates are unacceptable for Xpert Xpress SARS-CoV-2/FLU/RSV testing.  Fact Sheet for Patients: [place]  Fact Sheet for Healthcare Providers: BloggerCourse.com  This test is not yet approved or cleared by the [place] FDA and has been authorized for detection and/or diagnosis of SARS-CoV-2 by FDA under an Emergency Use Authorization (EUA). This EUA will remain in effect (meaning this test can be used) for the duration of the COVID-19 declaration under Section 564(b)(1) of the Act, 21 U.S.C. section 360bbb-3(b)(1), unless the authorization is terminated or revoked.  Performed at [facility], [address], [place], [place]      Labs: BNP (last 3 results) No results for input(s): BNP in the last 8760 hours. Basic Metabolic Panel: Recent  Labs  Lab 11/02/20 1902 11/02/20 1909 11/02/20 2100 11/03/20 0505 11/04/20 0415 11/05/20 0458  NA 137 135  --  138 136 136  K 4.3 3.9  --  3.1* 5.1 4.3  CL 101 101  --  106 110 109  CO2  --  24  --  28 20* 23  GLUCOSE 91 93  --  119* 85 99  BUN 6 8  --  6 <5* <5*  CREATININE 0.60 0.60  --  0.67 0.51 0.54  CALCIUM  --  9.4  --  8.5* 8.4* 8.6*  MG  --   --  1.9 1.7 2.4 2.3  PHOS  --   --   --  2.2* 2.3* 3.4   Liver Function Tests: Recent Labs  Lab 11/02/20 1909 11/03/20 0505 11/04/20 0415  AST 59* 42* 44*  ALT 70* 56* 60*  ALKPHOS 55 41 42  BILITOT 1.0 0.8 0.7  PROT 7.2 5.4*  5.9*  ALBUMIN 3.9 3.0* 3.2*   No results for input(s): LIPASE, AMYLASE in the last 168 hours. Recent Labs  Lab 11/02/20 2130  AMMONIA 49*   CBC: Recent Labs  Lab 11/02/20 1902 11/02/20 1909 11/03/20 0505 11/04/20 0415  WBC  --  4.7 4.2 5.0  NEUTROABS  --  2.6  --   --   HGB 16.0* 14.7 13.3 14.0  HCT 47.0* 43.9 40.3 42.8  MCV  --  102.8* 104.7* 105.4*  PLT  --  197 171 172   Cardiac Enzymes: No results for input(s): CKTOTAL, CKMB, CKMBINDEX, TROPONINI in the last 168 hours. BNP: Invalid input(s): POCBNP CBG: No results for input(s): GLUCAP in the last 168 hours. D-Dimer No results for input(s): DDIMER in the last 72 hours. Hgb A1c No results for input(s): HGBA1C in the last 72 hours. Lipid Profile No results for input(s): CHOL, HDL, LDLCALC, TRIG, CHOLHDL, LDLDIRECT in the last 72 hours. Thyroid function studies Recent Labs    11/04/20 0415  TSH 3.537   Anemia work up No results for input(s): VITAMINB12, FOLATE, FERRITIN, TIBC, IRON, RETICCTPCT in the last 72 hours. Urinalysis    Component Value Date/Time   COLORURINE YELLOW 11/02/2020 2316   APPEARANCEUR CLEAR 11/02/2020 2316   LABSPEC 1.008 11/02/2020 2316   PHURINE 7.0 11/02/2020 2316   GLUCOSEU NEGATIVE 11/02/2020 2316   HGBUR NEGATIVE 11/02/2020 2316   BILIRUBINUR NEGATIVE 11/02/2020 2316   KETONESUR 5 (A) 11/02/2020 2316   PROTEINUR NEGATIVE 11/02/2020 2316   NITRITE NEGATIVE 11/02/2020 2316   LEUKOCYTESUR NEGATIVE 11/02/2020 2316   Sepsis Labs Invalid input(s): PROCALCITONIN,  WBC,  LACTICIDVEN Microbiology Recent Results (from the past 240 hour(s))  Resp Panel by RT-PCR (Flu A&B, Covid) Nasopharyngeal Swab     Status: None   Collection Time: 11/02/20  8:47 PM   Specimen: Nasopharyngeal Swab; Nasopharyngeal(NP) swabs in vial transport medium  Result Value Ref Range Status   SARS Coronavirus 2 by RT PCR NEGATIVE NEGATIVE Final    Comment: (NOTE) SARS-CoV-2 target nucleic acids are NOT  DETECTED.  The SARS-CoV-2 RNA is generally detectable in upper respiratory specimens during the acute phase of infection. The lowest concentration of SARS-CoV-2 viral copies this assay can detect is 138 copies/mL. A negative result does not preclude SARS-Cov-2 infection and should not be used as the sole basis for treatment or other patient management decisions. A negative result may occur with  improper specimen collection/handling, submission of specimen other than nasopharyngeal swab, presence of viral mutation(s) within the areas targeted by  this assay, and inadequate number of viral copies(<138 copies/mL). A negative result must be combined with clinical observations, patient history, and epidemiological information. The expected result is Negative.  Fact Sheet for Patients:  BloggerCourse.com  Fact Sheet for Healthcare Providers:  [employer]  This test is no t yet approved or cleared by the [place] FDA and  has been authorized for detection and/or diagnosis of SARS-CoV-2 by FDA under an Emergency Use Authorization (EUA). This EUA will remain  in effect (meaning this test can be used) for the duration of the COVID-19 declaration under Section 564(b)(1) of the Act, 21 U.S.C.section 360bbb-3(b)(1), unless the authorization is terminated  or revoked sooner.       Influenza A by PCR NEGATIVE NEGATIVE Final   Influenza B by PCR NEGATIVE NEGATIVE Final    Comment: (NOTE) The Xpert Xpress SARS-CoV-2/FLU/RSV plus assay is intended as an aid in the diagnosis of influenza from Nasopharyngeal swab specimens and should not be used as a sole basis for treatment. Nasal washings and aspirates are unacceptable for Xpert Xpress SARS-CoV-2/FLU/RSV testing.  Fact Sheet for Patients: BloggerCourse.com  Fact Sheet for Healthcare Providers: [employer]  This test is not yet  approved or cleared by the [place] FDA and has been authorized for detection and/or diagnosis of SARS-CoV-2 by FDA under an Emergency Use Authorization (EUA). This EUA will remain in effect (meaning this test can be used) for the duration of the COVID-19 declaration under Section 564(b)(1) of the Act, 21 U.S.C. section 360bbb-3(b)(1), unless the authorization is terminated or revoked.  Performed at [facility], [address], [place], [place] [address]      Time coordinating discharge:  SIGNED:   Erick Blinks, MD  [facility] 11/06/2020, 10:22 AM   If 7PM-7AM, please contact night-coverage [email or web address]

## 2020-11-06 NOTE — Progress Notes (Signed)
AVS and discharge instructions reviewed with patient. [facility] updated on patient's status, diagnosis, and AVS. This facility also provided transportation for the patient. Patient verbalized understanding and had no further questions.

## 2020-11-07 LAB — VITAMIN B1: Vitamin B1 (Thiamine): 148.3 nmol/L (ref 66.5–200.0)

## 2022-10-31 IMAGING — MR MR HEAD WO/W CM
13 series · 48 of 48 positions shown · IV contrast (gadavist)
Comparison: None.

CLINICAL DATA: Dizziness, ataxia

EXAM:
MRI HEAD WITHOUT AND WITH CONTRAST
TECHNIQUE: Multiplanar, multiecho pulse sequences of the brain and surrounding
structures were obtained without and with intravenous contrast.
CONTRAST:  7mL GADAVIST GADOBUTROL 1 MMOL/ML IV SOLN

[Series 9: DWI · axial · 3.0mm · 1.36mm/px · z∈[-127,+39]mm · 5 of 116 slices shown (1 of 2)]
[im 1/116]
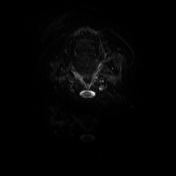
[im 29/116]
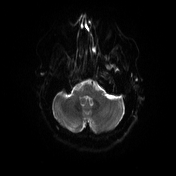
[im 58/116]
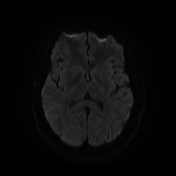
[im 87/116]
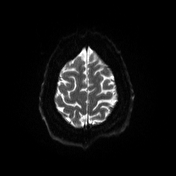
[im 116/116]
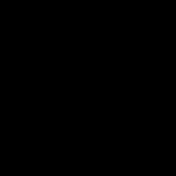

[Series 10: DWI · axial · 3.0mm · 1.36mm/px · z∈[-127,+34]mm · 2 of 56 slices shown (2 of 2)]
[im 1/56]
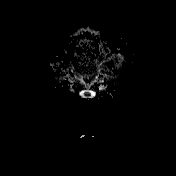
[im 56/56]
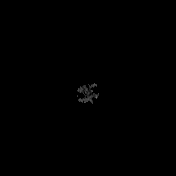

[Series 11: T1 · sagittal · 5.0mm · 0.75mm/px · 1 of 28 slices shown (1 of 2)]
[im 1/28]
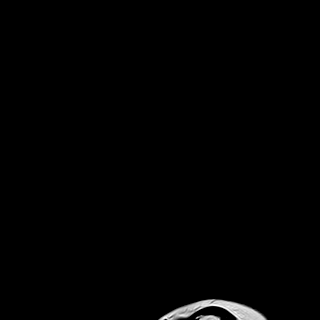

[Series 12: T2 · axial · 5.0mm · 0.62mm/px · z∈[-133,+44]mm · 2 of 29 slices shown]
[im 1/29]
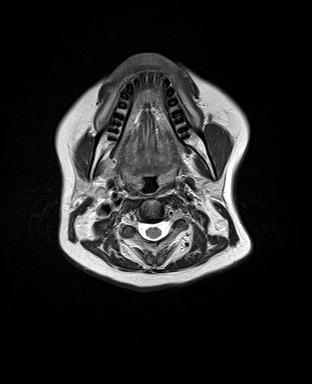
[im 29/29]
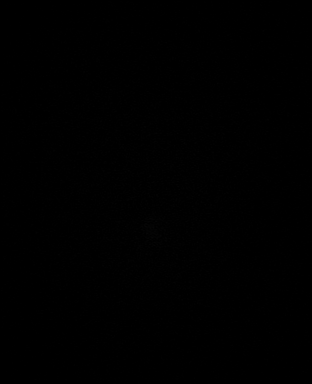

[Series 13: swi_images · axial · 3.0mm · 0.75mm/px · z∈[-148,+60]mm · 4 of 72 slices shown]
[im 1/72]
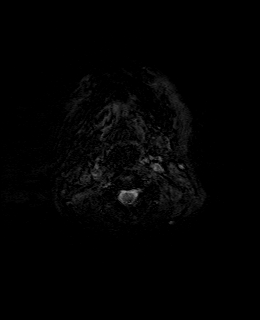
[im 24/72]
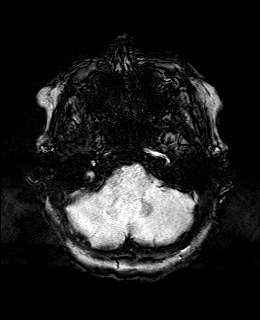
[im 48/72]
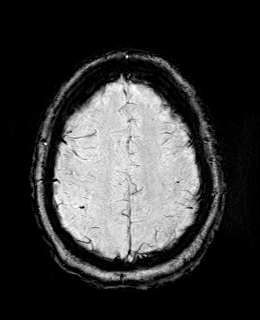
[im 72/72]
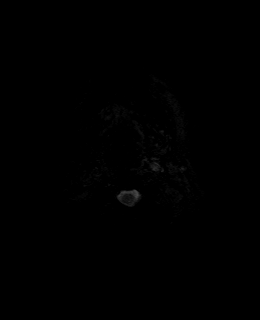

[Series 15: FLAIR · axial · 3.0mm · 0.75mm/px · z∈[-119,+30]mm · 3 of 52 slices shown]
[im 1/52]
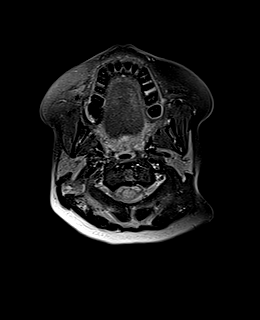
[im 26/52]
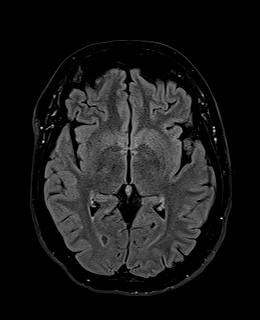
[im 52/52]
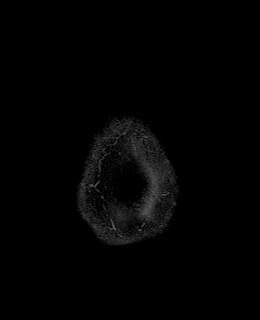

[Series 16: T1 · axial · 1.0mm · 0.94mm/px · z∈[-125,+45]mm · 10 of 176 slices shown (2 of 2)]
[im 1/176]
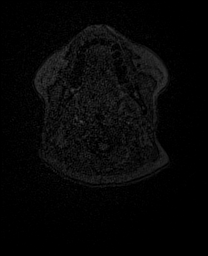
[im 20/176]
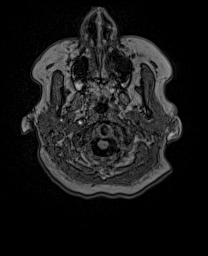
[im 39/176]
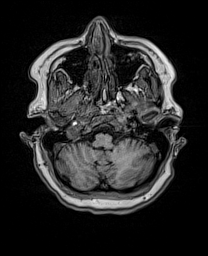
[im 59/176]
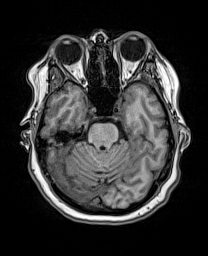
[im 78/176]
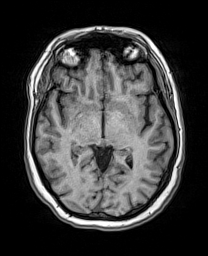
[im 98/176]
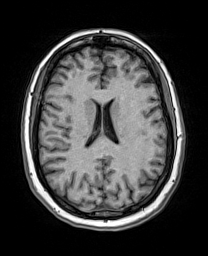
[im 117/176]
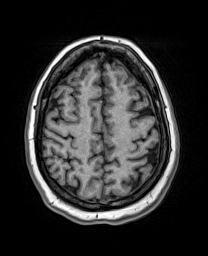
[im 137/176]
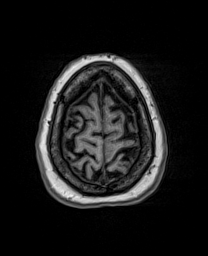
[im 156/176]
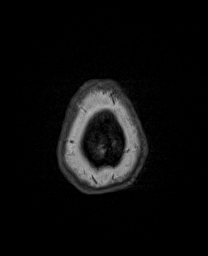
[im 176/176]
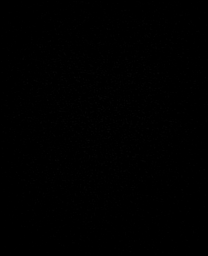

[Series 17: cor dwi_tracew · coronal · 5.0mm · 1.53mm/px · 3 of 60 slices shown]
[im 1/60]
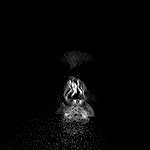
[im 30/60]
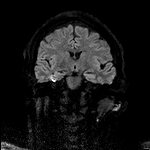
[im 60/60]
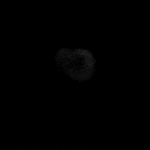

[Series 18: cor dwi_adc · coronal · 5.0mm · 1.53mm/px · 2 of 30 slices shown]
[im 1/30]
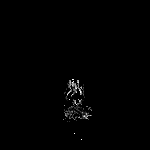
[im 30/30]
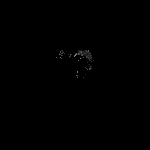

[Series 19: T2 post-contrast · coronal · 5.0mm · 0.57mm/px · 2 of 32 slices shown]
[im 1/32]
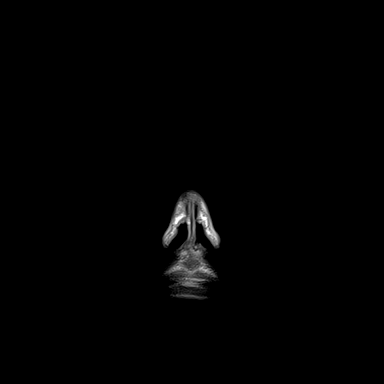
[im 32/32]
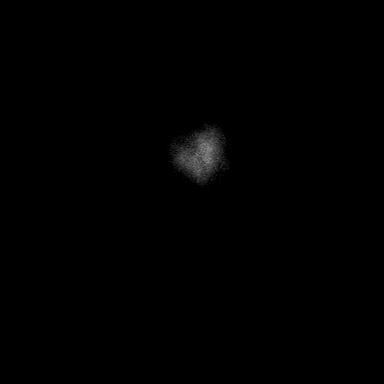

[Series 20: T1 post-contrast · axial · 1.0mm · 0.94mm/px · z∈[-125,+45]mm · 10 of 176 slices shown (1 of 3)]
[im 1/176]
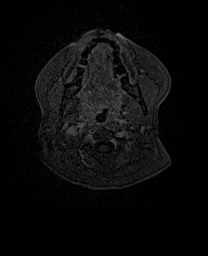
[im 20/176]
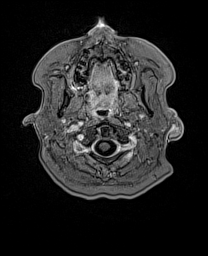
[im 39/176]
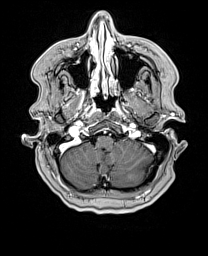
[im 59/176]
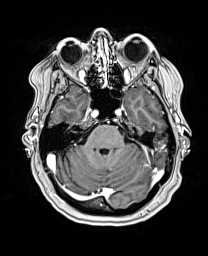
[im 78/176]
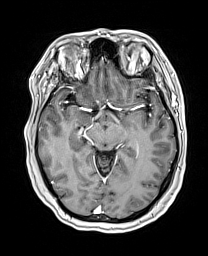
[im 98/176]
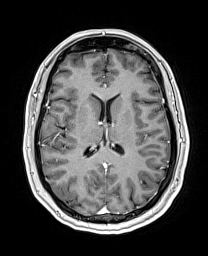
[im 117/176]
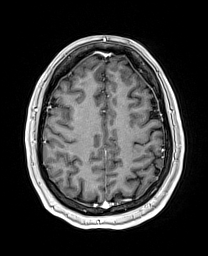
[im 137/176]
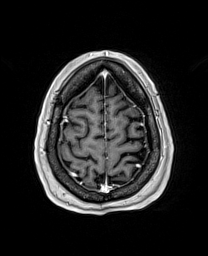
[im 156/176]
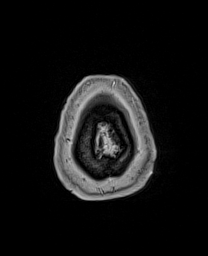
[im 176/176]
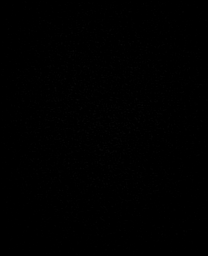

[Series 21: T1 post-contrast · coronal · 5.0mm · 0.43mm/px · 2 of 32 slices shown (2 of 3)]
[im 1/32]
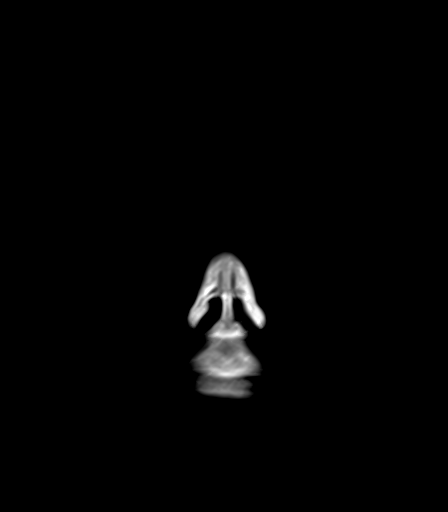
[im 32/32]
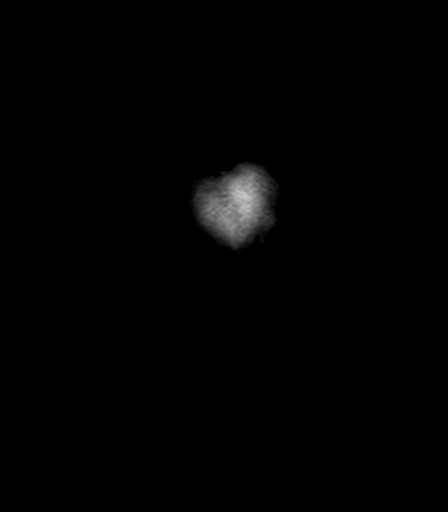

[Series 22: T1 post-contrast · sagittal · 5.0mm · 0.75mm/px · 2 of 28 slices shown (3 of 3)]
[im 1/28]
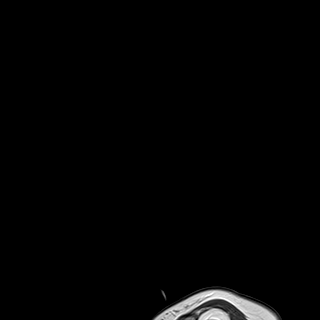
[im 28/28]
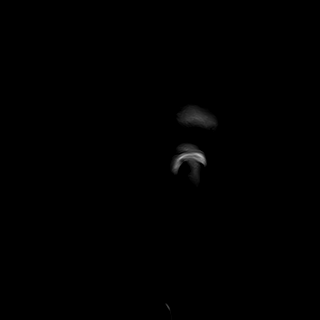

[48 of 48 positions shown; findings below may reference images not displayed]

FINDINGS: Brain: There is no acute intracranial hemorrhage, extra-axial fluid
collection, or acute infarct.

There is no parenchymal signal abnormality. The ventricles are
normal in size.

There is no mass abnormal enhancement.  There is no midline shift.

Vascular: Normal flow voids.

Skull and upper cervical spine: Normal marrow signal.

Sinuses/Orbits: The paranasal sinuses are clear. The globes and
orbits are unremarkable.

Other: None.
IMPRESSION: Normal brain MRI.
